# Patient Record
Sex: Female | Born: 1980 | Race: Black or African American | Hispanic: No | Marital: Married | Smoking: Never smoker
Health system: Southern US, Community
[De-identification: ages and names within clinical notes are randomized; demographics above are authoritative.]

## PROBLEM LIST (undated history)

## (undated) DIAGNOSIS — Z862 Personal history of diseases of the blood and blood-forming organs and certain disorders involving the immune mechanism: Secondary | ICD-10-CM

## (undated) HISTORY — PX: TUBAL LIGATION: SHX77

## (undated) HISTORY — PX: CHOLECYSTECTOMY: SHX55

## (undated) HISTORY — PX: GALLBLADDER SURGERY: SHX652

---

## 2001-02-10 ENCOUNTER — Observation Stay (HOSPITAL_COMMUNITY): Admission: AD | Admit: 2001-02-10 | Discharge: 2001-02-11 | Payer: Self-pay | Admitting: *Deleted

## 2001-02-17 ENCOUNTER — Inpatient Hospital Stay (HOSPITAL_COMMUNITY): Admission: AD | Admit: 2001-02-17 | Discharge: 2001-02-19 | Payer: Self-pay | Admitting: *Deleted

## 2002-02-17 ENCOUNTER — Ambulatory Visit (HOSPITAL_COMMUNITY): Admission: RE | Admit: 2002-02-17 | Discharge: 2002-02-17 | Payer: Self-pay | Admitting: *Deleted

## 2002-02-17 ENCOUNTER — Encounter: Payer: Self-pay | Admitting: *Deleted

## 2002-04-13 ENCOUNTER — Ambulatory Visit (HOSPITAL_COMMUNITY): Admission: AD | Admit: 2002-04-13 | Discharge: 2002-04-13 | Payer: Self-pay | Admitting: *Deleted

## 2002-04-18 ENCOUNTER — Inpatient Hospital Stay (HOSPITAL_COMMUNITY): Admission: AD | Admit: 2002-04-18 | Discharge: 2002-04-21 | Payer: Self-pay | Admitting: *Deleted

## 2002-09-30 ENCOUNTER — Emergency Department (HOSPITAL_COMMUNITY): Admission: EM | Admit: 2002-09-30 | Discharge: 2002-09-30 | Payer: Self-pay | Admitting: Emergency Medicine

## 2002-09-30 ENCOUNTER — Encounter: Payer: Self-pay | Admitting: *Deleted

## 2003-09-13 ENCOUNTER — Emergency Department (HOSPITAL_COMMUNITY): Admission: EM | Admit: 2003-09-13 | Discharge: 2003-09-13 | Payer: Self-pay | Admitting: Emergency Medicine

## 2003-12-01 ENCOUNTER — Emergency Department (HOSPITAL_COMMUNITY): Admission: EM | Admit: 2003-12-01 | Discharge: 2003-12-01 | Payer: Self-pay | Admitting: Emergency Medicine

## 2004-01-09 ENCOUNTER — Emergency Department (HOSPITAL_COMMUNITY): Admission: EM | Admit: 2004-01-09 | Discharge: 2004-01-09 | Payer: Self-pay | Admitting: Emergency Medicine

## 2004-01-30 ENCOUNTER — Ambulatory Visit: Payer: Self-pay | Admitting: Family Medicine

## 2004-02-12 ENCOUNTER — Ambulatory Visit (HOSPITAL_COMMUNITY): Admission: RE | Admit: 2004-02-12 | Discharge: 2004-02-12 | Payer: Self-pay | Admitting: Family Medicine

## 2004-02-27 ENCOUNTER — Ambulatory Visit: Payer: Self-pay | Admitting: Family Medicine

## 2004-03-04 ENCOUNTER — Emergency Department (HOSPITAL_COMMUNITY): Admission: EM | Admit: 2004-03-04 | Discharge: 2004-03-04 | Payer: Self-pay | Admitting: Emergency Medicine

## 2004-03-05 ENCOUNTER — Inpatient Hospital Stay (HOSPITAL_COMMUNITY): Admission: RE | Admit: 2004-03-05 | Discharge: 2004-03-08 | Payer: Self-pay | Admitting: Emergency Medicine

## 2004-03-26 ENCOUNTER — Ambulatory Visit: Payer: Self-pay | Admitting: Family Medicine

## 2004-05-19 ENCOUNTER — Ambulatory Visit: Payer: Self-pay | Admitting: Family Medicine

## 2004-10-20 ENCOUNTER — Ambulatory Visit (HOSPITAL_COMMUNITY): Admission: AD | Admit: 2004-10-20 | Discharge: 2004-10-20 | Payer: Self-pay | Admitting: Obstetrics and Gynecology

## 2004-10-29 ENCOUNTER — Ambulatory Visit (HOSPITAL_COMMUNITY): Admission: AD | Admit: 2004-10-29 | Discharge: 2004-10-29 | Payer: Self-pay | Admitting: Obstetrics and Gynecology

## 2004-11-02 ENCOUNTER — Ambulatory Visit (HOSPITAL_COMMUNITY): Admission: AD | Admit: 2004-11-02 | Discharge: 2004-11-02 | Payer: Self-pay | Admitting: Obstetrics and Gynecology

## 2004-11-15 ENCOUNTER — Ambulatory Visit (HOSPITAL_COMMUNITY): Admission: RE | Admit: 2004-11-15 | Discharge: 2004-11-15 | Payer: Self-pay | Admitting: Obstetrics and Gynecology

## 2004-12-24 ENCOUNTER — Inpatient Hospital Stay (HOSPITAL_COMMUNITY): Admission: AD | Admit: 2004-12-24 | Discharge: 2004-12-26 | Payer: Self-pay | Admitting: Obstetrics and Gynecology

## 2005-02-04 ENCOUNTER — Ambulatory Visit (HOSPITAL_COMMUNITY): Admission: RE | Admit: 2005-02-04 | Discharge: 2005-02-04 | Payer: Self-pay | Admitting: Obstetrics & Gynecology

## 2005-04-03 ENCOUNTER — Emergency Department (HOSPITAL_COMMUNITY): Admission: EM | Admit: 2005-04-03 | Discharge: 2005-04-03 | Payer: Self-pay | Admitting: Emergency Medicine

## 2005-11-07 ENCOUNTER — Emergency Department (HOSPITAL_COMMUNITY): Admission: EM | Admit: 2005-11-07 | Discharge: 2005-11-07 | Payer: Self-pay | Admitting: Emergency Medicine

## 2006-05-08 ENCOUNTER — Emergency Department (HOSPITAL_COMMUNITY): Admission: EM | Admit: 2006-05-08 | Discharge: 2006-05-08 | Payer: Self-pay | Admitting: Emergency Medicine

## 2006-07-23 ENCOUNTER — Emergency Department (HOSPITAL_COMMUNITY): Admission: EM | Admit: 2006-07-23 | Discharge: 2006-07-23 | Payer: Self-pay | Admitting: Emergency Medicine

## 2007-05-03 ENCOUNTER — Emergency Department (HOSPITAL_COMMUNITY): Admission: EM | Admit: 2007-05-03 | Discharge: 2007-05-03 | Payer: Self-pay | Admitting: Emergency Medicine

## 2007-05-23 ENCOUNTER — Emergency Department (HOSPITAL_COMMUNITY): Admission: EM | Admit: 2007-05-23 | Discharge: 2007-05-23 | Payer: Self-pay | Admitting: Emergency Medicine

## 2007-07-11 ENCOUNTER — Ambulatory Visit (HOSPITAL_COMMUNITY): Admission: RE | Admit: 2007-07-11 | Discharge: 2007-07-11 | Payer: Self-pay | Admitting: Internal Medicine

## 2007-07-15 ENCOUNTER — Emergency Department (HOSPITAL_COMMUNITY): Admission: EM | Admit: 2007-07-15 | Discharge: 2007-07-15 | Payer: Self-pay | Admitting: Emergency Medicine

## 2007-09-07 ENCOUNTER — Emergency Department (HOSPITAL_COMMUNITY): Admission: EM | Admit: 2007-09-07 | Discharge: 2007-09-07 | Payer: Self-pay | Admitting: Emergency Medicine

## 2007-11-08 ENCOUNTER — Emergency Department (HOSPITAL_COMMUNITY): Admission: EM | Admit: 2007-11-08 | Discharge: 2007-11-08 | Payer: Self-pay | Admitting: Emergency Medicine

## 2008-09-20 ENCOUNTER — Emergency Department (HOSPITAL_COMMUNITY): Admission: EM | Admit: 2008-09-20 | Discharge: 2008-09-20 | Payer: Self-pay | Admitting: Emergency Medicine

## 2008-11-20 ENCOUNTER — Emergency Department (HOSPITAL_COMMUNITY): Admission: EM | Admit: 2008-11-20 | Discharge: 2008-11-20 | Payer: Self-pay | Admitting: Emergency Medicine

## 2010-04-09 LAB — URINALYSIS, ROUTINE W REFLEX MICROSCOPIC
Bilirubin Urine: NEGATIVE
Glucose, UA: NEGATIVE mg/dL
Ketones, ur: NEGATIVE mg/dL
Protein, ur: NEGATIVE mg/dL

## 2010-04-09 LAB — URINE MICROSCOPIC-ADD ON

## 2010-04-09 LAB — URINE CULTURE

## 2010-04-11 LAB — COMPREHENSIVE METABOLIC PANEL
Albumin: 3.5 g/dL (ref 3.5–5.2)
BUN: 16 mg/dL (ref 6–23)
Creatinine, Ser: 0.86 mg/dL (ref 0.4–1.2)
Glucose, Bld: 82 mg/dL (ref 70–99)
Total Bilirubin: 1 mg/dL (ref 0.3–1.2)
Total Protein: 6.7 g/dL (ref 6.0–8.3)

## 2010-04-11 LAB — DIFFERENTIAL
Basophils Absolute: 0.1 10*3/uL (ref 0.0–0.1)
Lymphocytes Relative: 22 % (ref 12–46)
Monocytes Absolute: 0.5 10*3/uL (ref 0.1–1.0)
Monocytes Relative: 8 % (ref 3–12)
Neutro Abs: 4.6 10*3/uL (ref 1.7–7.7)
Neutrophils Relative %: 68 % (ref 43–77)

## 2010-04-11 LAB — CBC
HCT: 34.4 % — ABNORMAL LOW (ref 36.0–46.0)
Hemoglobin: 11.6 g/dL — ABNORMAL LOW (ref 12.0–15.0)
MCV: 82.6 fL (ref 78.0–100.0)
Platelets: 208 10*3/uL (ref 150–400)
RDW: 16.2 % — ABNORMAL HIGH (ref 11.5–15.5)

## 2010-04-11 LAB — URINALYSIS, ROUTINE W REFLEX MICROSCOPIC
Hgb urine dipstick: NEGATIVE
Protein, ur: NEGATIVE mg/dL
Urobilinogen, UA: 2 mg/dL — ABNORMAL HIGH (ref 0.0–1.0)

## 2010-05-23 NOTE — Discharge Summary (Signed)
Cynthia [name], Cynthia [name]              ACCOUNT NO.:  [id number]   MEDICAL RECORD NO.:  [id number]          PATIENT TYPE:  INP   LOCATION:  A336                          FACILITY:  [facility]   PHYSICIAN:  Jerolyn Shin C. Katrinka Blazing, M.D.   DATE OF BIRTH:  Mar 26, 1980   DATE OF ADMISSION:  03/05/2004  DATE OF DISCHARGE:  03/04/2006LH                                 DISCHARGE SUMMARY   DISCHARGE DIAGNOSES:  1.  Cholelithiasis, cholecystitis.  2.  Hypokalemia.   PROCEDURE:  Laparoscopic cholecystectomy on March 07, 2004.   DISPOSITION:  The patient discharged to home in stable, satisfactory  condition.   DISCHARGE MEDICATIONS:  1.  Tylox 1 or 2 every 4 hours as needed for pain.  2.  Phenergan 25 mg every 4 hours as needed for nausea.   SUMMARY:  A 30 year old female admitted for acute cholecystitis with  cholelithiasis. She has a greater than 2 week history of right upper  quadrant and back pain. She states that she had similar pains in November  and December of 2005 but they resolved. The pain became much worse 4 days  prior to admission. She had recurrent episodes of nausea and vomiting. She  was seen in the emergency room and was noted to have epigastric and right  upper quadrant tenderness. Ultrasound was done on the day of admission and  showed a gallbladder filled with stones with thickened wall. It was felt  that she had acute cholecystitis. The patient also had hypokalemia because  of nausea and vomiting. She was admitted for intravenous therapy, anti-  emetic therapy, and to replace her potassium prior to having definitive  surgical treatment of her gallbladder disease.   PAST MEDICAL HISTORY:  Otherwise unremarkable.   PHYSICAL EXAMINATION:  VITAL SIGNS:  Examination revealed her to be afebrile  with a blood pressure of 109/74, pulse 58, respiratory rate 20.  ABDOMEN:  Showed epigastric and right upper quadrant tenderness with mild  right lower quadrant tenderness with normal bowel  sounds.   LABORATORY DATA:  Serum potassium was 3.2, sodium 134, white count was  7,100. Liver panel was normal.   HOSPITAL COURSE:  The patient received intravenous and oral potassium. By  the 2nd, she felt much better. Potassium was 3.9. The patient was scheduled  for surgery on the 3rd, which she tolerated well. She had non  perioperative problems. By the 4th, she was stable. She had mild discomfort  from her incision. She had no nausea or vomiting. Vital signs were stable.  She was afebrile. The patient was discharged home in satisfactory condition  with plans to followup in the office as an outpatient.      LCS/MEDQ  D:  04/20/2004  T:  04/20/2004  Job:  [id number]   cc:   Dorthula Rue. Early Chars, MD  Fax: [phone]

## 2010-05-23 NOTE — H&P (Signed)
Cynthia [name], Cynthia [name]              ACCOUNT NO.:  [id number]   MEDICAL RECORD NO.:  [id number]          PATIENT TYPE:  OIB   LOCATION:  A415                          FACILITY:  [facility]   PHYSICIAN:  Tilda Burrow, M.D. DATE OF BIRTH:  [birth year]   DATE OF ADMISSION:  10/20/2004  DATE OF DISCHARGE:  LH                                HISTORY & PHYSICAL   REASON FOR ADMISSION:  Pregnancy at 28 weeks and 2 days with some uterine  contractions.   HISTORY OF PRESENT ILLNESS:  Cynthia [name] presented to the office today  complaining of some occasional cramping.  Mild contractions were noted with  the patient.  The patient was sent to labor and delivery.  The patient was  noted on the electronic fetal monitor to be having some very mild  contractions anywhere from 3-10 minutes apart.  Brethine 5 mg p.o. was given  with resolution of contractions noted.  The patient denies any pain at the  present time or any discomfort and desires discharge home.   PAST MEDICAL HISTORY:  Negative.   PAST SURGICAL HISTORY:  Positive for gallstones.   ALLERGIES:  She has no known allergies.   PLAN:  We are going to discharge her after giving her azithromycin 2 g p.o.  for too numerous to count wbc's on a wet prep.  GC and Chlamydia were  obtained at the office and cervix was closed, soft, long, and presenting  part is high.  We are also going to give her Brethine 5 mg p.o. q.4 h.  p.r.n. uterine contractions.  She knows she is to come back to the office  Friday, or if the contractions recur, she knows she is to come back as soon  as regular contraction pattern of more than four in one hour.      Cynthia [name], Cynthia [name]      Tilda Burrow, M.D.  Electronically Signed    DL/MEDQ  D:  16/10/9602  T:  10/20/2004  Job:  [id number]   cc:   [facility]

## 2010-05-23 NOTE — Op Note (Signed)
[facility]  Patient:    Cynthia [name], Cynthia [name] Visit Number: [id number] MRN: [id number]          Service Type: OBS Location: 4A A427 01 Attending Physician:  Jeri Cos. Dictated by:   Langley Gauss, M.D. Proc. Date: 02/17/01 Admit Date:  02/17/2001                             Operative Report  BRIEF HISTORY:  A 37-1/2 week intrauterine pregnancy presenting in labor, spontaneous assisted vaginal delivery of a 5 lb 12 ounce female infant delivered over an intact perineum.  SURGEON:  Langley Gauss, M.D.  ESTIMATED BLOOD LOSS:  Approximately 500 cc.  SPECIMENS:  Arterial cord gas and cord blood to pathology.  Placenta was examined and noted to be apparently intact with a true vessel and buccal cord.  SUMMARY:  The patient is a gravida 3, para 2, at 37-1/[redacted] weeks gestation who presented to [facility] complaining of increased frequency and intensity of uterine contractions throughout the day.  The patient has been seen previously in the office on todays date for a scheduled office visit at which time upon evaluation she was noted to be having regular uterine contractions and dilated to 4 cm.  She was thus referred to [facility] where she was noted to be contracting q.3-5 minutes with increasing intensity with a reassuring fetal heart rate.  The patient was then admitted in early stages of labor, thereafter amniotomy was performed with findings of clear amniotic fluid.  The patient had a reassuring heart rate throughout the course of labor with the onset of active labor contractions.  The patient began having decelerations which appeared to be either early or late type decelerations. When I evaluated the patient these were consistent with early decelerations. The patient thereafter progressed normally along the labor curve.  She began having some severe variable decelerations at which time she was examined and noted to be 8 cm  dilated.  Thereafter she progressed with 9 with gentle traction put on the cervix and was easily reduced to 10 cm.  Thereafter the patient pushed well during the short second stage of labor.  She was placed in the dorsolithotomy position, prepped and draped in the usual sterile manner. She pushed well with decent of the vertex to the peritoneal floor after which time the infant was noted to deliver in an direct OA position over an intact perineum.  Mouth and nares of the infant were bulb suctioned of clear amniotic fluid.  Renewed expulsive efforts resulted in delivery of the remainder of the infant without difficulty.  There was noted to an umbilical cord x 1 which was wrapped around the infants legs.  The umbilical cord was milked towards the infant, cord was doubly clamped and cut and infant is placed on the maternal abdomen for bonding purposes.  Arterial cord gas and cord blood are then obtained.  GEntle traction of the umbilical cord resulted in separation which upon examination appears to be an intact 3 vessel placenta and cord.  Examination of the genital tract reveals no lacerations and thus the mother is taken out of dorsolithotomy position . She does plan on bottle feeding only and this infant by her report has been placed up for adoption and arrangements have already been made with the adoption agency. Dictated by:   Langley Gauss, M.D. Attending Physician:  Jeri Cos. DD:  02/17/01 TD:  02/18/01 Job: [id number]

## 2010-05-23 NOTE — Op Note (Signed)
NAMEJUNICE, Cynthia [name]              ACCOUNT NO.:  [id number]   MEDICAL RECORD NO.:  [id number]          PATIENT TYPE:  INP   LOCATION:  A411                          FACILITY:  [facility]   PHYSICIAN:  Lazaro Arms, M.D.   DATE OF BIRTH:  November 28, 1980   DATE OF PROCEDURE:  12/24/2004  DATE OF DISCHARGE:  12/26/2004                                 OPERATIVE REPORT   Cynthia [name] is a 30 year old gravida 4, para 3 with 3 living children who came  in in active phase of labor who progressed quickly through the active phase  of labor. She underwent artificial rupture of membranes. She got Nubain for  pain management. At 2107 on December 24, 2004 after a short expulsive phase,  Cynthia [name] delivered a viable female infant with Apgars of 9 and 9, weighing 5  pounds and 2 ounces. There was a three-vessel cord. Cord blood and cord gas  were sent. Placenta was delivered spontaneously and was normal and intact.  The uterus was firm and below the umbilicus, and the blood loss for the  delivery was approximately 250 cc. The perineum was intact, and there were  no lacerations noted. The infant underwent routine neonatal resuscitation by  the nursing staff. The patient tolerated the delivery well, and she will  undergo routine postpartum care.      Lazaro Arms, M.D.  Electronically Signed     LHE/MEDQ  D:  01/21/2005  T:  01/21/2005  Job:  [id number]

## 2010-05-23 NOTE — Op Note (Signed)
NAMEPIEDAD, STANDIFORD              ACCOUNT NO.:  [id number]   MEDICAL RECORD NO.:  [id number]          PATIENT TYPE:  AMB   LOCATION:  DAY                           FACILITY:  [facility]   PHYSICIAN:  Lazaro Arms, M.D.   DATE OF BIRTH:  [birth year]   DATE OF PROCEDURE:  02/04/2005  DATE OF DISCHARGE:                                 OPERATIVE REPORT   PREOPERATIVE DIAGNOSIS:  Multiparous female, desires permanent  sterilization.   POSTOPERATIVE DIAGNOSIS:  Multiparous female, desires permanent  sterilization.   PROCEDURE:  Laparoscopic tubal ligation.   SURGEON:  Dr. Despina Hidden.   ANESTHESIA:  General endotracheal.   FINDINGS:  The patient had normal uterus, tubes and ovaries. Normal  intraperitoneal cavity.   DESCRIPTION OF OPERATION:  The patient was taken to the operating room,  placed in a supine position, under general endotracheal anesthesia, placed  in dorsal lithotomy position, prepped and draped in usual sterile fashion.  Incision made in the umbilicus. Veress needle was placed into the peritoneal  cavity one pass without difficulty. Peritoneal cavity was insufflated. The  nonbladed direct vision trocar was used and placed in the peritoneal cavity  with one pass without difficulty. Peritoneal cavity was insufflated more,  and an electrocautery unit was used, and a 2-cm segment in the distal  isthmic ampullary region of each tube was burned to no resistance and beyond  using electrocautery unit. There was good hemostasis. The patient tolerated  the procedure well. The instruments were removed. The gas was allowed to  escape. The fascia was closed using 0 Vicryl. The subcutaneous tissue was  closed using 0 Vicryl. Skin was closed using skin staples. The patient  tolerated the procedure well. She experienced minimal blood loss and was  taken to the recovery room in good and stable condition. She received Ancef  and Toradol prophylactically.      Lazaro Arms, M.D.  Electronically Signed     LHE/MEDQ  D:  02/04/2005  T:  02/04/2005  Job:  [id number]

## 2010-05-23 NOTE — H&P (Signed)
NAMETEMPRENCE, RHINES              ACCOUNT NO.:  [id number]   MEDICAL RECORD NO.:  [id number]          PATIENT TYPE:  INP   LOCATION:  A411                          FACILITY:  [facility]   PHYSICIAN:  Cynthia [name], M.D.   DATE OF BIRTH:  1980/04/26   DATE OF ADMISSION:  12/24/2004  DATE OF DISCHARGE:  12/22/2006LH                                HISTORY & PHYSICAL   Cynthia [name] is a 30 year old gravida 4, para 3 at [redacted] weeks gestation who  presents in active phase of labor. She has a history of rapid labor. She is  admitted for labor management.   PAST MEDICAL HISTORY:  Negative.   PAST SURGICAL HISTORY:  1.  Gallstones.  2.  Cholecystectomy.   PAST OBSTETRICAL HISTORY:  She has had three vaginal deliveries, two of  which have been 34 weeks. This pregnancy has been remarkable for preterm  labor, and she was on Brethine.   LABORATORY DATA:  She is B positive. Antibody screen is negative. Her  initial drug screen was initially positive for marijuana. Rubella is immune.  Hepatitis B was negative. HIV is nonreactive. HSV2 was negative. Serology is  nonreactive. GC and chlamydia are negative x2. Group B strep is negative.  Glucola is normal.   PHYSICAL EXAMINATION:  HEENT:  Unremarkable.  NECK:  Thyroid normal.  LUNGS:  Clear.  HEART:  Regular rhythm without murmur, rub or gallop.  BREASTS:  Deferred.  ABDOMEN:  Fundal height is 36 cm.  CERVIX:  At the time of admission is 4, 90 and 0.   IMPRESSION:  1.  Intrauterine pregnancy at [redacted] weeks gestation.  2.  Active phase of labor.   PLAN:  Management of labor and vaginal delivery.     Cynthia [name], M.D.  Electronically Signed    LHE/MEDQ  D:  01/21/2005  T:  01/21/2005  Job:  [id number]

## 2010-05-23 NOTE — H&P (Signed)
NAMECELSA, NORDAHL              ACCOUNT NO.:  [id number]   MEDICAL RECORD NO.:  [id number]          PATIENT TYPE:  AMB   LOCATION:  DAY                           FACILITY:  [facility]   PHYSICIAN:  Lazaro Arms, M.D.   DATE OF BIRTH:  May 23, 1980   DATE OF ADMISSION:  DATE OF DISCHARGE:  LH                                HISTORY & PHYSICAL   HISTORY OF PRESENT ILLNESS:  Cing is a 30 year old African-American  female, gravida 4, para 4, who desires permanent sterilization.  She  delivered back in December, a normal spontaneous vaginal delivery.  She  understands the permanent nature of the procedure.   PAST MEDICAL HISTORY:  Negative.   PAST SURGICAL HISTORY:  Cholecystectomy.   PAST OBSTETRICAL HISTORY:  Four vaginal deliveries.   ALLERGIES:  None.   MEDICATIONS:  None.   PHYSICAL EXAMINATION:  HEENT:  Unremarkable.  NECK:  Thyroid is normal.  LUNGS:  Clear.  HEART:  Regular rate and rhythm without murmur, regurgitation or gallop.  BREASTS:  Deferred.  ABDOMEN:  Benign.  Normal postpartum.  PELVIC:  Normal.  Uterus is normally involuted.  Ovaries are normal and  nontender.  EXTREMITIES:  Warm with no edema.  NEUROLOGIC:  Grossly intact.   IMPRESSION:  Multiparous female, desires permanent sterilization.   PLAN:  The patient is admitted for laparoscopic tubal ligation.  She  understands the risks, benefits, indications and alternatives and will  proceed.      Lazaro Arms, M.D.  Electronically Signed    LHE/MEDQ  D:  02/03/2005  T:  02/04/2005  Job:  [id number]

## 2010-05-23 NOTE — H&P (Signed)
[name], [name]                        ACCOUNT NO.:  [id number]   MEDICAL RECORD NO.:  [id number]                   PATIENT TYPE:  INP   LOCATION:  A417                                 FACILITY:  [facility]   PHYSICIAN:  Langley Gauss, M.D.                DATE OF BIRTH:  [birth year]   DATE OF ADMISSION:  04/18/2002  DATE OF DISCHARGE:  04/21/2002                                HISTORY & PHYSICAL   HISTORY OF PRESENT ILLNESS:  This is a 30 year old gravida 5, para 3 at 47  and 5/7ths weeks gestation who present to [facility] in early labor.  Patient was noted to have irregular uterine contractions during the daytime  and was noted on her observation period in labor and delivery to have  cervical changes to 6 cm dilated minus one stage and 60% effaced.  Thus she  was admitted in the early stage of active labor.   OBSTETRICAL HISTORY:  1. Pertinent for spontaneous vaginal delivery times two 1998 and 2000.     Infants delivered weighed 5 pounds 2.5 ounces and 4 pounds 2.4 ounces.  2. Patient had a termination of pregnancy 12/1998.  February 2003 patient     had an additional vaginal delivery this baby has been adopted out.  3. Likewise this pregnancy the patient has made preparations already for a     directed adoption.  4. Patient's prenatal course of this pregnancy has been uncomplicated.   ALLERGIES:  She has no known drug allergies.   CURRENT MEDICATIONS:  Prenatal vitamins only.   PHYSICAL EXAMINATION:  GENERAL:  A black female, height 5 foot 2, pre-  pregnancy weight 115, most recent 137, blood pressure 101/53, pulse 87,  respiratory rate 18, temp 97.2.  HEENT:  Reveals mucous membranes to be moist.  Thyroid is not palpable.  NECK:  Supple.  LUNGS:  Clear.  CARDIOVASCULAR:  Regular, rate and rhythm.  ABDOMEN:  Soft and nontender.  Gravid uterus identified.  Fundal height is  34 cm.  She is vertex presentation by [employer].  She has no  surgical scars  identified.  PELVIC:  Cervix is noted to be 6 cm dilated minus one station, posterior  position of the cervix, 60% effaced, vertex presentation noted.   HOSPITAL COURSE:  External fetal monitor reveals a reassuring fetal heart  rate.  Fetal heart rate baseline at 150,  accelerations noted.  No fetal  heart rate decelerations are noted.  Contractions are noted to be very mild  only every 5-15 minutes in frequency.    ASSESSMENT:  A 37 and 5/7ths weeks intrauterine pregnancy patient with  demonstrated cervical change during this observation period now at 6 cm  dilated, thus, patient is admitted in labor.  We will proceed with amniotomy  thereafter augment with Pitocin as clinically indicated.  Langley Gauss, M.D.    DC/MEDQ  D:  04/22/2002  T:  04/23/2002  Job:  [id number]

## 2010-05-23 NOTE — Discharge Summary (Signed)
Cynthia [name], Cynthia [name]              ACCOUNT NO.:  [id number]   MEDICAL RECORD NO.:  [id number]          PATIENT TYPE:  OIB   LOCATION:  A415                          FACILITY:  [facility]   PHYSICIAN:  Langley Gauss, MD     DATE OF BIRTH:  [birth year]   DATE OF ADMISSION:  11/02/2004  DATE OF DISCHARGE:  LH                                 DISCHARGE SUMMARY   HISTORY OF PRESENT ILLNESS:  A 30 year old gravida 3, para 2 at about [redacted]  weeks gestation who states she had some bloody mucus with wiping x1 today  and also complained of some pelvic cramping while working.  Apparently she  was on labor and delivery 4 days previously for similar complaints, at which  time she was treated with subcutaneous terbutaline sent home on p.o.  terbutaline which she has not had filled to this point in time.  She denies  any vaginal bleeding on undergarments, denies any leakage of fluid, any  significant vaginal discharge, and since arrival here she states that she  has had pelvic cramping with only one contraction since receiving a single  subcutaneous terbutaline.  The patient's prenatal course felt to be  otherwise uncomplicated.  She has B positive blood type.  She was positive  for THC on initial urine drug screen.   PAST MEDICAL HISTORY:  She has 2 prior vaginal deliveries without  complications.  She does have a history of gallstones.  No other medical or  surgical history.   PHYSICAL EXAMINATION:  No acute distress.  108/70, pulse rate 80,  respiratory rate is 20.  HEENT is negative.  No adenopathy.  NECK:  Supple, thyroid is nonpalpable.  LUNGS:  Clear.  CARDIOVASCULAR:  Regular rate and rhythm.  ABDOMEN:  Soft and nontender.  No surgical scars were identified.  Fundi is  consistent with [redacted] weeks gestation.  Uterus is soft and nontender.  PELVIC EXAMINATION:  Per __________, cervix is known to be closed, very long  and firm, normal appearing leukorrhea with no vaginal bleeding noted.  External fetal  monitor reveals a reassuring fetal heart rate.  No evidence  of any urine activity identified.   ASSESSMENT AND PLAN:  A 30-week gestation with recurrent threatened pre-term  labor.  The patient was treated x1 with subcutaneous terbutaline.  She is  discharged home at this time and will have the p.o. terbutaline filled and  comply with previous recommendations.  Signs and symptoms of labor as well  as spontaneous ruptured membranes reviewed with the patient prior to  discharge.   LABORATORY DATA:  October 29, 2004:  GBS carrier status negative.     Langley Gauss, MD  Electronically Signed    DC/MEDQ  D:  11/02/2004  T:  11/02/2004  Job:  [id number]

## 2010-05-23 NOTE — H&P (Signed)
NAMEIVONNA, KINNICK              ACCOUNT NO.:  [id number]   MEDICAL RECORD NO.:  [id number]          PATIENT TYPE:  OIB   LOCATION:  LDR2                          FACILITY:  [facility]   PHYSICIAN:  Tilda Burrow, M.D. DATE OF BIRTH:  1980-01-26   DATE OF ADMISSION:  10/29/2004  DATE OF DISCHARGE:  LH                                HISTORY & PHYSICAL   OBSERVATION ADMISSION   REASON FOR ADMISSION:  Pregnancy at 29 weeks with uterine cramping and pink  spotting.   HISTORY OF PRESENT ILLNESS:  Patient is a 30 year old gravida 4, para 2,  admitted with some mild cramping from home.   PAST MEDICAL HISTORY:  Negative.   PAST SURGICAL HISTORY:  Positive for gallbladder.   ALLERGIES:  She has no known allergies.   PRENATAL COURSE:  Essentially uneventful.  Blood type is A positive.  UDS  was positive with initial screening for THC. Rubella is immune.  Hepatitis B  surface antigen is negative.  HIV is negative.  Serology is nonreactive.   PHYSICAL EXAMINATION:  Vital signs were Stable.  Cervix is posterior, long,  closed and presenting part is high.   The patient was having contractions on admission, but resolved with 1 subcu  injection of terbutaline.   PLAN:  After approximately 4 hours of observation the patient has had no  uterine contractions and no changes in cervical dilatation.  She is to  followup in the office on Monday at 1:30 with Dr. Emelda Fear for assessment.  Also she knows that if she has more than 6 contractions in an hour she is to  come back. She has a prescription for Brethine 5 mg p.o. q.4 h. p.r.n. any  cramping; and she knows that she can come back or call, if needed, at any  time.     Zerita Boers, Lanier Clam      Tilda Burrow, M.D.  Electronically Signed   DL/MEDQ  D:  16/10/9602  T:  10/29/2004  Job:  [id number]

## 2010-05-23 NOTE — Op Note (Signed)
Cynthia [name], Cynthia [name]              ACCOUNT NO.:  [id number]   MEDICAL RECORD NO.:  [id number]          PATIENT TYPE:  INP   LOCATION:  A336                          FACILITY:  [facility]   PHYSICIAN:  Jerolyn Shin C. Katrinka Blazing, M.D.   DATE OF BIRTH:  Apr 19, 1980   DATE OF PROCEDURE:  03/07/2004  DATE OF DISCHARGE:  03/08/2004                                 OPERATIVE REPORT   PREOPERATIVE DIAGNOSIS:  Cholecystitis, cholelithiasis.   POSTOPERATIVE DIAGNOSIS:  Cholecystitis, cholelithiasis.   OPERATION/PROCEDURE:  Laparoscopic cholecystectomy.   SURGEON:  Dirk Dress. Katrinka Blazing, M.D.   DESCRIPTION OF PROCEDURE:  Under general anesthesia the patient's abdomen  was prepped and draped into a sterile field.  A supraumbilical incision was  made.  A Veress needle was inserted uneventfully.  Abdomen was insufflated  with 2.5 L of CO2.  Using a Visiport guide, a 10 mm port was placed  uneventfully.  Laparoscope was placed and the gallbladder was visualized.  The patient was placed under reverse Trendelenburg position.  Under  videoscopic guidance, a 10 mm port and two 5 mm ports were placed in the  right subcostal region.  The gallbladder was grasped by the assistant.  Cystic duct was dissected and clipped with five clips and divided.  The  cystic artery branches were dissected, clipped with three clips and divided.  The gallbladder was then separated from the intrahepatic space with  electrocautery.  It was placed in an EndoCatch device and retrieved.  Irrigation was carried out.  There was minimal drainage and essentially no  blood loss.  CO2 was allowed to escape from the abdomen and the ports were  removed.  The incisions were closed using 0 Dexon on the fascia of the  larger supraumbilical port and staples on the skin.  The patient tolerated  the procedure well.  She was awakened from anesthesia uneventfully,  transferred to a bed and taken to the post anesthesia care unit.      LCS/MEDQ  D:  04/20/2004  T:   04/21/2004  Job:  [id number]

## 2010-05-23 NOTE — Discharge Summary (Signed)
   NAMEJAMESINA, GAUGH                        ACCOUNT NO.:  [id number]   MEDICAL RECORD NO.:  [id number]                   PATIENT TYPE:  INP   LOCATION:  A417                                 FACILITY:  [facility]   PHYSICIAN:  Langley Gauss, M.D.                DATE OF BIRTH:  06/10/1980   DATE OF ADMISSION:  04/18/2002  DATE OF DISCHARGE:  04/21/2002                                 DISCHARGE SUMMARY   PROCEDURE PERFORMED:  Spontaneous assisted vaginal delivery of a viable,  vigorous female infant which went up for prearranged private adoption  services.   DISPOSITION:  The patient would desire permanent sterilization.  She will  follow up in our office in three weeks time at which time we can schedule  this for a five to six week postpartum tubal ligation.   DISCHARGE MEDICATIONS:  Vicoprofen for pain relief.   LABORATORY DATA:  B positive blood type.  Admission hemoglobin and  hematocrit of 9.2 and 28.4, postpartum day #1 9 and 27.8.  The patient is to  continue her prenatal vitamins for supplemental iron.   DISCHARGE INSTRUCTIONS:  The patient is given a copy of the standardized  discharge instructions.   HOSPITAL COURSE:  See previous dictations.  Postpartum the patient remained  in hospital until April 21, 2002 which was also the day that the baby left  for the adoption services.  At no point in time did the patient state any  indecisiveness regarding her proceeding with adoption proceedings as she has  had one previous infant that has likewise been placed through adoption  services.  She was discharged home on April 21, 2002.                                               Langley Gauss, M.D.    DC/MEDQ  D:  04/23/2002  T:  04/23/2002  Job:  [id number]

## 2010-05-23 NOTE — Discharge Summary (Signed)
[facility]  Patient:    [name], Cynthia Visit Number: [id number] MRN: [id number]          Service Type: OBS Location: 4A A427 01 Attending Physician:  Jeri Cos. Dictated by:   Langley Gauss, M.D. Admit Date:  02/17/2001 Discharge Date: 02/19/2001                             Discharge Summary  Patient discharged in my absence by Dr. Despina Hidden from [facility].  DIAGNOSES:  A 37 1/2 week intrauterine pregnancy presenting in labor, delivery performed on February 17, 2001.  PROCEDURE:  Spontaneous assisted vaginal delivery of 5 pound 11.9 ounce female infant.  Patient had made arrangements prior to the delivery for adoption to be performed through an adoption agency.  PERTINENT LABORATORY STUDIES:  B+ blood type.  Admission hemoglobin and hematocrit of 10.0/29.2, postpartum day #1 10.0/30.6.  HOSPITAL COURSE:  See previous dictations.  Patient did well postpartum.  She had no postpartum complications.  She was discharged home in my absence by Dr. Duane Lope on February 19, 2001.  Apparently, there were no changes in plans regarding plans for adoption of this infant through an agency. Dictated by:   Langley Gauss, M.D. Attending Physician:  Jeri Cos. DD:  03/03/01 TD:  03/03/01 Job: [id number]

## 2010-05-23 NOTE — Op Note (Signed)
NAME:  Cynthia [name], Cynthia [name]                        ACCOUNT NO.:  [id number]   MEDICAL RECORD NO.:  [id number]                   PATIENT TYPE:  INP   LOCATION:  A417                                 FACILITY:  [facility]   PHYSICIAN:  Langley Gauss, M.D.                DATE OF BIRTH:  1980/09/25   DATE OF PROCEDURE:  04/19/2002  DATE OF DISCHARGE:                                 OPERATIVE REPORT   DELIVERY NOTE:   DIAGNOSIS:  A 37-5/7 week intrauterine pregnancy presenting in prodromal  labor.   PROCEDURE:  Spontaneous assisted vaginal delivery of a 5 pound 13 ounce  female infant delivered over an intact perineum.   FINDINGS:  An umbilical cord which was wrapped one time around the right leg  below the knee, which had resulted in compression and variable decelerations  during the course of labor.   PHYSICIAN:  Langley Gauss, M.D.   ESTIMATED BLOOD LOSS:  Less than 500 mL.   SPECIMENS:  Arterial cord gas and cord blood to pathology laboratory.  The  placenta was examined and noted to be apparently intact with a three-vessel  umbilical cord.   MEDICATIONS:  The patient did not receive any narcotics or local analgesic  at time of delivery.   DELIVERY NOTE:  The patient is a gravida 5, para 3, at 37-5/7 weeks'  intrauterine pregnancy, who presented to [facility] the a.m. of  April 18, 2002, complaining of increasing frequency and intensity of uterine  contractions.  The patient was examined at that time and noted to be 5 cm  dilated, 60% effaced, with the vertex at a -1 station.  The patient was  observed throughout the morning.  She continued to have irregular uterine  contractions, but she did make cervical change to 6 cm.  Thus, in the p.m.  of 04/19/02 amniotomy was performed with findings of clear amniotic fluid.  A  fetal scalp electrode was placed.  The patient's labor course was  complicated by the moderate and severe variable decelerations due to cord  compression.   The patient was noted to progress rapidly from 6 to 8 cm  dilatation and continued to have increasing depth associated with the  decelerations.  The patient was noted to progress to 9 cm, at which time she  was encouraged to push.  The cervix was reduced to complete dilatation.  Thereafter the patient pushed very ineffectually during the second stage of  labor.  She was placed in the dorsal lithotomy position in an effort to  facilitate her pushing efforts.  The patient thereafter was able to push to  effect delivery over an intact perineum.  Mouth and nares were bulb-  suctioned of clear amniotic fluid, and renewed expulsive efforts resulted in  the spontaneous rotation to a right anterior shoulder position.  Gentle  abdominal traction combined with expulsive efforts resulted in delivery of  this anterior  shoulder beneath the pubic symphysis without difficulty.  A  spontaneous and vigorous breathe and cry is noted.  Arterial cord gas and  cord blood are then obtained from the placenta.  Gentle traction on the  umbilical cord results in separation, which upon examination appears to be  an intact placenta with associated three-vessel cord.  Fundal massage was  performed immediately to obtain uterine  tone.  No excessive vaginal bleeding is noted.  The patient was taken out of  the dorsal lithotomy position.  As the infant is to be placed for adoption,  which has been pre-arranged, the mother herself had minimal contact with the  infant, which thereafter was taken to the newborn nursery.                                               Langley Gauss, M.D.    DC/MEDQ  D:  04/19/2002  T:  04/19/2002  Job:  [id number]   cc:   Francoise Schaumann. Halm, D.O.  [address], [address]  [place]  [place] [address]  Fax: [phone]

## 2010-05-23 NOTE — H&P (Signed)
Cynthia [name], Cynthia [name]              ACCOUNT NO.:  [id number]   MEDICAL RECORD NO.:  [id number]          PATIENT TYPE:  INP   LOCATION:  A336                          FACILITY:  [facility]   PHYSICIAN:  Jerolyn Shin C. Katrinka Blazing, M.D.   DATE OF BIRTH:  11-Jan-1980   DATE OF ADMISSION:  03/05/2004  DATE OF DISCHARGE:  LH                                HISTORY & PHYSICAL   HISTORY OF PRESENT ILLNESS:  A 30 year old female admitted for acute  cholecystitis with cholelithiasis.  The patient gives a history of having  greater than a 2-week history of right upper quadrant and back pain.  The  pain was intermittent.  She had had previous pains in November and December,  but they resolved.  The pains became much more severe 4 days prior to  admission.  She had recurrent episodes of nausea with vomiting.  She was  seen in the emergency room on the day prior to admission, and was noted to  have epigastric and right upper quadrant tenderness, with an otherwise  normal evaluation.  She had an ultrasound done on the day of admission which  showed a gallbladder filled with gallstones, with the thickened wall.  The  patient has acute cholecystitis.  She is hypokalemic from her nausea and  vomiting, and will need potassium replacement.  She is admitted for  treatment, and will have a laparoscopic cholecystectomy this admission.   PAST HISTORY:  She has no chronic medical illness.   MEDICATIONS:  Her only medications is Ortho Tri-Cyclen Lo.   SURGERIES:  None.   ALLERGIES:  None.   FAMILY HISTORY:  Positive for diabetes mellitus, hypertension, and breast  cancer.  She has 3 siblings, one who has hypertension.   PHYSICAL EXAMINATION:  GENERAL:  The patient is very comfortable at the time  of the evaluation, but she has had mild resolution of pain.  She has had IV  analgesics.  VITAL SIGNS:  Blood pressure is 109/74, pulse 58, respirations 20,  temperature 98 degrees.  HEENT:  Unremarkable.  She is not jaundice.  NECK:  Supple.  No JVD, bruit, adenopathy, or thyromegaly.  CHEST:  Clear to auscultation.  HEART:  Regular rate and rhythm without murmur, gallop, or rub.  ABDOMEN:  Epigastric and mild right upper quadrant tenderness.  There is  also mild right lower quadrant tenderness.  She has normoactive bowel  sounds.  There is no abdominal distention.  EXTREMITIES:  No clubbing, cyanosis, or edema.  NEUROLOGIC:  No focal motor, sensory, or cerebellar deficits.   IMPRESSION:  1.  Acute cholecystitis with cholelithiasis.  2.  Hypokalemia.   PLAN:  The patient is admitted.  She will be given IV analgesics and anti-  emetics.  She will be started on Ancef 1 gm IV q.6h.  She will have  potassium replacement, IV and orally as tolerated, and we will schedule a  cholecystectomy on this admission.      LCS/MEDQ  D:  03/06/2004  T:  03/06/2004  Job:  [id number]

## 2010-09-30 LAB — INFLUENZA A+B VIRUS AG-DIRECT(RAPID)
Inflenza A Ag: NEGATIVE
Influenza B Ag: NEGATIVE

## 2010-10-08 LAB — CBC
HCT: 38.2
Platelets: 200
RDW: 15
WBC: 6

## 2010-10-08 LAB — BASIC METABOLIC PANEL
BUN: 13
Calcium: 9.5
GFR calc non Af Amer: >60
Glucose, Bld: 92
Potassium: 3.6

## 2010-10-08 LAB — DIFFERENTIAL
Basophils Absolute: 0
Lymphocytes Relative: 28
Lymphs Abs: 1.7
Neutro Abs: 3.7
Neutrophils Relative %: 62

## 2010-10-20 LAB — CBC
HCT: 37.4
Hemoglobin: 12.3
MCHC: 32.8
Platelets: 198
RDW: 16.4 — ABNORMAL HIGH

## 2010-10-20 LAB — URINALYSIS, ROUTINE W REFLEX MICROSCOPIC
Bilirubin Urine: NEGATIVE
Nitrite: POSITIVE — AB
Specific Gravity, Urine: 1.025
Urobilinogen, UA: 0.2
pH: 6

## 2010-10-20 LAB — DIFFERENTIAL
Basophils Absolute: 0
Lymphocytes Relative: 21
Lymphs Abs: 1.4
Monocytes Absolute: 0.4
Monocytes Relative: 6
Neutro Abs: 4.8

## 2010-10-20 LAB — BASIC METABOLIC PANEL
BUN: 13
CO2: 25
Calcium: 9.3
GFR calc non Af Amer: >60
Glucose, Bld: 85
Sodium: 140

## 2010-10-20 LAB — URINE CULTURE: Colony Count: 35000

## 2010-10-20 LAB — URINE MICROSCOPIC-ADD ON

## 2010-10-20 LAB — PREGNANCY, URINE: Preg Test, Ur: NEGATIVE

## 2011-05-26 ENCOUNTER — Emergency Department (HOSPITAL_COMMUNITY)
Admission: EM | Admit: 2011-05-26 | Discharge: 2011-05-26 | Disposition: A | Discharge status: Home / Self Care | Payer: BC Managed Care – PPO | Attending: Emergency Medicine | Admitting: Emergency Medicine

## 2011-05-26 ENCOUNTER — Encounter (HOSPITAL_COMMUNITY): Payer: Self-pay

## 2011-05-26 DIAGNOSIS — M25539 Pain in unspecified wrist: Secondary | ICD-10-CM | POA: Insufficient documentation

## 2011-05-26 DIAGNOSIS — M65849 Other synovitis and tenosynovitis, unspecified hand: Secondary | ICD-10-CM | POA: Insufficient documentation

## 2011-05-26 DIAGNOSIS — M778 Other enthesopathies, not elsewhere classified: Secondary | ICD-10-CM

## 2011-05-26 DIAGNOSIS — M65839 Other synovitis and tenosynovitis, unspecified forearm: Secondary | ICD-10-CM | POA: Insufficient documentation

## 2011-05-26 MED ORDER — IBUPROFEN 400 MG PO TABS
600.0000 mg | ORAL_TABLET | Freq: Once | ORAL | Status: AC
  Administered 2011-05-26: 600 mg via ORAL
  Filled 2011-05-26: qty 2

## 2011-05-26 NOTE — ED Provider Notes (Signed)
Medical screening examination/treatment/procedure(s) were performed by non-physician practitioner and as supervising physician I was immediately available for consultation/collaboration.   Joya Gaskins, MD 05/26/11 1114

## 2011-05-26 NOTE — Discharge Instructions (Signed)
Tendinitis Tendinitis is swelling and inflammation of the tendons. Tendons are band-like tissues that connect muscle to bone. Tendinitis commonly occurs in the:   Shoulders (rotator cuff).   Heels (Achilles tendon).   Elbows (triceps tendon).  CAUSES Tendinitis is usually caused by overusing the tendon, muscles, and joints involved. When the tissue surrounding a tendon (synovium) becomes inflamed, it is called tenosynovitis. Tendinitis commonly develops in people whose jobs require repetitive motions. SYMPTOMS  Pain.   Tenderness.   Mild swelling.  DIAGNOSIS Tendinitis is usually diagnosed by physical exam. Your caregiver may also order X-rays or other imaging tests. TREATMENT Your caregiver may recommend certain medicines or exercises for your treatment. HOME CARE INSTRUCTIONS   Use a sling or splint for as long as directed by your caregiver until the pain decreases.   Put ice on the injured area.   Put ice in a plastic bag.   Place a towel between your skin and the bag.   Leave the ice on for 15 to 20 minutes, 3 to 4 times a day.   Avoid using the limb while the tendon is painful. Perform gentle range of motion exercises only as directed by your caregiver. Stop exercises if pain or discomfort increase, unless directed otherwise by your caregiver.   Only take over-the-counter or prescription medicines for pain, discomfort, or fever as directed by your caregiver.  SEEK MEDICAL CARE IF:   Your pain and swelling increase.   You develop new, unexplained symptoms, especially increased numbness in the hands.  MAKE SURE YOU:   Understand these instructions.   Will watch your condition.   Will get help right away if you are not doing well or get worse.  Document Released: 12/20/1999 Document Revised: 12/11/2010 Document Reviewed: 03/10/2010 [facility] Patient Information 2012 [place], [place].   Take ibuprofen 600 mg every 8 hrs with food.  Wear the splint for comfort.   Follow up with dr. Hilda Lias as needed.

## 2011-05-26 NOTE — ED Notes (Signed)
Pt reports pain to her right wrist for the past 3 days.  Pt denies any injury.  Pt reports pain worsens with movement.

## 2011-05-26 NOTE — ED Provider Notes (Signed)
History     CSN: [id number]  Arrival date & time 05/26/11  4098   First MD Initiated Contact with Patient 05/26/11 [phone]      Chief Complaint  Patient presents with  . Wrist Pain    (Consider location/radiation/quality/duration/timing/severity/associated sxs/prior treatment) HPI Comments: No known injury.  R hand dominant.  "soreness".  No radiation.  Patient is a 31 y.o. female presenting with wrist pain. The history is provided by the patient.  Wrist Pain Episode onset: 3 days ago. The problem occurs constantly. The problem has been unchanged. Pertinent negatives include no joint swelling, numbness or weakness. Exacerbated by: wrist movement and thumb extension. She has tried nothing for the symptoms.    History reviewed. No pertinent past medical history.  Past Surgical History  Procedure Date  . Tubal ligation     No family history on file.  History  Substance Use Topics  . Smoking status: Never Smoker   . Smokeless tobacco: Not on file  . Alcohol Use: No    OB History    Grav Para Term Preterm Abortions TAB SAB Ect Mult Living                  Review of Systems  Musculoskeletal: Negative for joint swelling.  Neurological: Negative for weakness and numbness.  All other systems reviewed and are negative.    Allergies  Review of patient's allergies indicates no known allergies.  Home Medications  No current outpatient prescriptions on file.  BP 129/83  Pulse 80  Temp(Src) 98.3 F (36.8 C) (Oral)  Resp 18  Ht 5\' 2"  (1.575 m)  Wt 133 lb (60.328 kg)  BMI 24.33 kg/m2  SpO2 100%  LMP 04/10/2011  Physical Exam  Nursing note and vitals reviewed. Constitutional: She is oriented to person, place, and time. She appears well-developed and well-nourished. No distress.  HENT:  Head: Normocephalic and atraumatic.  Eyes: EOM are normal.  Neck: Normal range of motion.  Cardiovascular: Normal rate, regular rhythm and normal heart sounds.   Pulmonary/Chest:  Effort normal and breath sounds normal.  Abdominal: Soft. She exhibits no distension. There is no tenderness.  Musculoskeletal: Normal range of motion. She exhibits tenderness.       Right wrist: She exhibits tenderness. She exhibits normal range of motion, no bony tenderness, no effusion, no crepitus, no deformity and no laceration.       Right hand: normal sensation noted. Normal strength noted.       Hands: Neurological: She is alert and oriented to person, place, and time.  Skin: Skin is warm and dry.  Psychiatric: She has a normal mood and affect. Judgment normal.    ED Course  Procedures (including critical care time)  Labs Reviewed - No data to display No results found.   1. Tendonitis of wrist, right       MDM  Thumb spica, ice, ibuprofen 600 mg TID with food.  F/u with dr. Hilda Lias.        Worthy Rancher, [place] 05/26/11 [phone]

## 2017-02-16 ENCOUNTER — Other Ambulatory Visit: Payer: Self-pay

## 2017-02-16 ENCOUNTER — Emergency Department (HOSPITAL_COMMUNITY)
Admission: EM | Admit: 2017-02-16 | Discharge: 2017-02-16 | Disposition: A | Discharge status: Home / Self Care | Payer: Self-pay | Attending: Emergency Medicine | Admitting: Emergency Medicine

## 2017-02-16 ENCOUNTER — Encounter (HOSPITAL_COMMUNITY): Payer: Self-pay | Admitting: *Deleted

## 2017-02-16 DIAGNOSIS — Z79899 Other long term (current) drug therapy: Secondary | ICD-10-CM | POA: Insufficient documentation

## 2017-02-16 DIAGNOSIS — G43819 Other migraine, intractable, without status migrainosus: Secondary | ICD-10-CM

## 2017-02-16 DIAGNOSIS — Z7982 Long term (current) use of aspirin: Secondary | ICD-10-CM | POA: Insufficient documentation

## 2017-02-16 DIAGNOSIS — G43909 Migraine, unspecified, not intractable, without status migrainosus: Secondary | ICD-10-CM | POA: Insufficient documentation

## 2017-02-16 MED ORDER — DIPHENHYDRAMINE HCL 50 MG/ML IJ SOLN
25.0000 mg | Freq: Once | INTRAMUSCULAR | Status: AC
Start: 2017-02-16 — End: 2017-02-16
  Administered 2017-02-16: 25 mg via INTRAVENOUS
  Filled 2017-02-16: qty 1

## 2017-02-16 MED ORDER — METOCLOPRAMIDE HCL 5 MG/ML IJ SOLN
10.0000 mg | Freq: Once | INTRAMUSCULAR | Status: AC
  Administered 2017-02-16: 10 mg via INTRAVENOUS
  Filled 2017-02-16: qty 2

## 2017-02-16 MED ORDER — SODIUM CHLORIDE 0.9 % IV BOLUS (SEPSIS)
1000.0000 mL | Freq: Once | INTRAVENOUS | Status: AC
  Administered 2017-02-16: 1000 mL via INTRAVENOUS

## 2017-02-16 MED ORDER — KETOROLAC TROMETHAMINE 30 MG/ML IJ SOLN
30.0000 mg | Freq: Once | INTRAMUSCULAR | Status: AC
  Administered 2017-02-16: 30 mg via INTRAVENOUS
  Filled 2017-02-16: qty 1

## 2017-02-16 NOTE — ED Provider Notes (Signed)
[facility] EMERGENCY DEPARTMENT Provider Note   CSN: [id number] Arrival date & time: 02/16/17  0825     History   Chief Complaint Chief Complaint  Patient presents with  . Headache    HPI Cynthia [name] is a 37 y.o. female.  Left frontal headache with radiation to the left cheek since yesterday morning with associated vomiting and photophobia.  No meningeal signs or gross neurological deficits.  She has a remote history of migraine headaches.  He has tried Tylenol, Excedrin with minimal relief.  Headache is described as pounding and worse with walking.  Severity is moderate.      History reviewed. No pertinent past medical history.  There are no active problems to display for this patient.   Past Surgical History:  Procedure Laterality Date  . TUBAL LIGATION      OB History    No data available       Home Medications    Prior to Admission medications   Medication Sig Start Date End Date Taking? Authorizing Provider  acetaminophen (TYLENOL) 500 MG tablet Take 500 mg by mouth every 6 (six) hours as needed.   Yes [provider]  aspirin-acetaminophen-caffeine (EXCEDRIN MIGRAINE) [phone] MG tablet Take 2 tablets by mouth every 6 (six) hours as needed for headache.   Yes [provider]    Family History No family history on file.  Social History Social History   Tobacco Use  . Smoking status: Never Smoker  . Smokeless tobacco: Never Used  Substance Use Topics  . Alcohol use: Yes    Comment: "every now and then"   . Drug use: Yes    Types: Marijuana    Comment: last used 1 week ago as of 02/16/17     Allergies   Patient has no known allergies.   Review of Systems Review of Systems  All other systems reviewed and are negative.    Physical Exam Updated Vital Signs BP 114/79   Pulse (!) 58   Temp 98.2 F (36.8 C) (Oral)   Resp 18   Ht 5\' 2"  (1.575 m)   Wt 61.2 kg (135 lb)   LMP 02/11/2017   SpO2 100%   BMI 24.69  kg/m   Physical Exam  Constitutional: She is oriented to person, place, and time. She appears well-developed and well-nourished.  HENT:  Head: Normocephalic and atraumatic.  Eyes: Conjunctivae are normal.  Neck: Neck supple.  Cardiovascular: Normal rate and regular rhythm.  Pulmonary/Chest: Effort normal and breath sounds normal.  Abdominal: Soft. Bowel sounds are normal.  Musculoskeletal: Normal range of motion.  Neurological: She is alert and oriented to person, place, and time.  Skin: Skin is warm and dry.  Psychiatric: She has a normal mood and affect. Her behavior is normal.  Nursing note and vitals reviewed.    ED Treatments / Results  Labs (all labs ordered are listed, but only abnormal results are displayed) Labs Reviewed - No data to display  EKG  EKG Interpretation None       Radiology No results found.  Procedures Procedures (including critical care time)  Medications Ordered in ED Medications  sodium chloride 0.9 % bolus 1,000 mL (0 mLs Intravenous Stopped 02/16/17 1312)  ketorolac (TORADOL) 30 MG/ML injection 30 mg (30 mg Intravenous Given 02/16/17 1012)  diphenhydrAMINE (BENADRYL) injection 25 mg (25 mg Intravenous Given 02/16/17 1012)  metoCLOPramide (REGLAN) injection 10 mg (10 mg Intravenous Given 02/16/17 1012)     Initial Impression / Assessment  and Plan / ED Course  I have reviewed the triage vital signs and the nursing notes.  Pertinent labs & imaging results that were available during my care of the patient were reviewed by me and considered in my medical decision making (see chart for details).     Patient presents with persistent headache for greater than 24 hours.  Physical exam was normal.  She feels much better after hydration, IV Toradol, IV Reglan, IV Benadryl.  She is symptom-free at discharge.  Final Clinical Impressions(s) / ED Diagnoses   Final diagnoses:  Other migraine without status migrainosus, intractable    ED Discharge  Orders    None       Nat Christen, MD 02/16/17 [id number]

## 2017-02-16 NOTE — ED Triage Notes (Signed)
Pt c/o headache, vomiting, light sensitivity since yesterday morning. Pt has used Tylenol with no relief of pain. No hx of migraines.

## 2017-02-16 NOTE — Discharge Instructions (Signed)
Increase fluids.  Rest in quiet dark room.  Follow-up with your primary care doctor.

## 2017-02-16 NOTE — ED Notes (Signed)
Family at bedside. 

## 2018-09-08 ENCOUNTER — Other Ambulatory Visit: Payer: Self-pay | Admitting: *Deleted

## 2018-09-08 DIAGNOSIS — N926 Irregular menstruation, unspecified: Secondary | ICD-10-CM

## 2018-09-16 ENCOUNTER — Ambulatory Visit (HOSPITAL_COMMUNITY)
Admission: RE | Admit: 2018-09-16 | Discharge: 2018-09-16 | Disposition: A | Discharge status: Home / Self Care | Payer: Self-pay | Source: Ambulatory Visit | Attending: *Deleted | Admitting: *Deleted

## 2018-09-16 ENCOUNTER — Other Ambulatory Visit: Payer: Self-pay

## 2018-09-16 DIAGNOSIS — N926 Irregular menstruation, unspecified: Secondary | ICD-10-CM | POA: Insufficient documentation

## 2018-10-14 ENCOUNTER — Telehealth: Payer: Self-pay | Admitting: Obstetrics & Gynecology

## 2018-10-14 NOTE — Telephone Encounter (Signed)

## 2018-10-17 ENCOUNTER — Encounter: Payer: Self-pay | Admitting: Obstetrics & Gynecology

## 2018-10-17 ENCOUNTER — Other Ambulatory Visit: Payer: Self-pay

## 2018-10-17 ENCOUNTER — Ambulatory Visit (INDEPENDENT_AMBULATORY_CARE_PROVIDER_SITE_OTHER): Payer: 59 | Admitting: Obstetrics & Gynecology

## 2018-10-17 VITALS — BP 127/62 | HR 80 | Ht 62.0 in | Wt 141.5 lb

## 2018-10-17 DIAGNOSIS — N921 Excessive and frequent menstruation with irregular cycle: Secondary | ICD-10-CM

## 2018-10-17 DIAGNOSIS — N946 Dysmenorrhea, unspecified: Secondary | ICD-10-CM

## 2018-10-17 DIAGNOSIS — D259 Leiomyoma of uterus, unspecified: Secondary | ICD-10-CM

## 2018-10-17 NOTE — Progress Notes (Signed)
Chief Complaint  Patient presents with  . Fibroids      38 y.o. [id number] Patient's last menstrual period was 09/25/2018. The current method of family planning is tubal ligation.  Outpatient Encounter Medications as of 10/17/2018  Medication Sig  . acetaminophen (TYLENOL) 500 MG tablet Take 500 mg by mouth every 6 (six) hours as needed.  Marland Kitchen ibuprofen (ADVIL) 200 MG tablet Take 600 mg by mouth as needed.  . [DISCONTINUED] aspirin-acetaminophen-caffeine (EXCEDRIN MIGRAINE) 250-250-65 MG tablet Take 2 tablets by mouth every 6 (six) hours as needed for headache.   No facility-administered encounter medications on file as of 10/17/2018.     Subjective Pt with 7-9 day periods clotting wears tampons and pads together Soils sheets clothes Painful cramps Rare discomfort with intercourse Has had BTL Sonogram reveals fibroid uterus 268 cc volume Ovaries normal by sonogram   History reviewed. No pertinent past medical history.  Past Surgical History:  Procedure Laterality Date  . GALLBLADDER SURGERY    . TUBAL LIGATION      OB History    Gravida  6   Para  5   Term  4   Preterm  1   AB  1   Living  3     SAB  1   TAB      Ectopic      Multiple      Live Births  3           No Known Allergies  Social History   Socioeconomic History  . Marital status: Married    Spouse name: Not on file  . Number of children: Not on file  . Years of education: Not on file  . Highest education level: Not on file  Occupational History  . Not on file  Social Needs  . Financial resource strain: Not on file  . Food insecurity    Worry: Not on file    Inability: Not on file  . Transportation needs    Medical: Not on file    Non-medical: Not on file  Tobacco Use  . Smoking status: Never Smoker  . Smokeless tobacco: Never Used  Substance and Sexual Activity  . Alcohol use: Yes    Comment: "every now and then"   . Drug use: Not Currently    Types: Marijuana     Comment: last used 1 week ago as of 02/16/17  . Sexual activity: Yes    Birth control/protection: Surgical    Comment: tubal  Lifestyle  . Physical activity    Days per week: Not on file    Minutes per session: Not on file  . Stress: Not on file  Relationships  . Social Herbalist on phone: Not on file    Gets together: Not on file    Attends religious service: Not on file    Active member of club or organization: Not on file    Attends meetings of clubs or organizations: Not on file    Relationship status: Not on file  Other Topics Concern  . Not on file  Social History Narrative  . Not on file    Family History  Problem Relation Age of Onset  . Aneurysm Maternal Grandmother   . Diabetes Father   . Breast cancer Mother   . Hypertension Sister     Medications:       Current Outpatient Medications:  .  acetaminophen (TYLENOL) 500 MG tablet, Take 500  mg by mouth every 6 (six) hours as needed., Disp: , Rfl:  .  ibuprofen (ADVIL) 200 MG tablet, Take 600 mg by mouth as needed., Disp: , Rfl:   Objective Blood pressure 127/62, pulse 80, height 5\' 2"  (1.575 m), weight 141 lb 8 oz (64.2 kg), last menstrual period 09/25/2018.  Gen WDWN NAD BMI 26  Pertinent ROS No burning with urination, frequency or urgency No nausea, vomiting or diarrhea Nor fever chills or other constitutional symptoms   Labs or studies Reviewed   Study Result  CLINICAL DATA:  Irregular menstrual cycle  EXAM: TRANSABDOMINAL AND TRANSVAGINAL ULTRASOUND OF PELVIS  TECHNIQUE: Both transabdominal and transvaginal ultrasound examinations of the pelvis were performed. Transabdominal technique was performed for global imaging of the pelvis including uterus, ovaries, adnexal regions, and pelvic cul-de-sac. It was necessary to proceed with endovaginal exam following the transabdominal exam to visualize the uterus and adnexa.  COMPARISON:  CT abdomen and pelvis 07/23/2006  FINDINGS:  Uterus  Measurements: 10.9 x 5.9 x 8.0 cm = volume: 268 mL. Anteverted. Large LEFT lateral exophytic leiomyoma 4.7 x 4.8 x 5.0 cm. No additional uterine masses. Mildly inhomogeneous myometrium otherwise seen.  Endometrium  Thickness: 11 mm.  No endometrial fluid or focal abnormality  Right ovary  Measurements: 3.7 x 1.4 x 4.4 cm = volume: 11.6 mL. Seen only on transabdominal imaging. Normal morphology without mass  Left ovary  Measurements: 3.2 x 1.7 x 2.7 cm = volume: 7.8 mL. Seen only on transabdominal imaging. Normal morphology without mass  Other findings  No free pelvic fluid or adnexal masses.  IMPRESSION: Large LEFT lateral exophytic leiomyoma 5.0 cm greatest size.  Otherwise unremarkable exam.   Electronically Signed   By: Lavonia Dana M.D.   On: 09/16/2018 16:04   CBC Latest Ref Rng & Units 09/20/2008 09/07/2007 07/23/2006  WBC 4.0 - 10.5 K/uL 6.8 6.0 6.8  Hemoglobin 12.0 - 15.0 g/dL 11.6(L) 12.5 12.3  Hematocrit 36.0 - 46.0 % 34.4(L) 38.2 37.4  Platelets 150 - 400 K/uL 208 200 198      Impression Diagnoses this Encounter::   ICD-10-CM   1. Uterine leiomyoma, unspecified location  D25.9   2. Menometrorrhagia  N92.1   3. Dysmenorrhea  N94.6     Established relevant diagnosis(es):   Plan/Recommendations: No orders of the defined types were placed in this encounter.   Labs or Scans Ordered: No orders of the defined types were placed in this encounter.   Management:: >discussed with pt Abdominal supracervical hysterectomy preservation of her ovaries and cervix and pt agrees.  Try to schedule 11/09/2018  Pt understands the risks of surgery including but not limited t  excessive bleeding requiring transfusion or reoperation, post-operative infection requiring prolonged hospitalization or re-hospitalization and antibiotic therapy, and damage to other organs including bladder, bowel, ureters and major vessels.  The patient also understands  the alternative treatment options which were discussed in full.  All questions were answered.  Florian Buff 10/17/2018 2:17 PM   Follow up Return in about 1 month (around 11/17/2018) for Post Op, with Dr Elonda Husky.        Face to face time:  20 minutes  Greater than 50% of the visit time was spent in counseling and coordination of care with the patient.  The summary and outline of the counseling and care coordination is summarized in the note above.   All questions were answered.

## 2018-10-25 DIAGNOSIS — Z029 Encounter for administrative examinations, unspecified: Secondary | ICD-10-CM

## 2018-11-03 NOTE — Patient Instructions (Signed)
Cynthia [name]  11/03/2018     @PREFPERIOPPHARMACY @   Your procedure is scheduled on  11/09/2018 .  Report to [facility] at  [phone]  A.M.  Call this number if you have problems the morning of surgery:  [phone]   Remember:  Do not eat or drink after midnight.                        Take these medicines the morning of surgery with A SIP OF WATER None    Do not wear jewelry, make-up or nail polish.  Do not wear lotions, powders, or perfumes. Please ware deodorant and brush your teeth.  Do not shave 48 hours prior to surgery.  Men may shave face and neck.  Do not bring valuables to the hospital.  [facility] is not responsible for any belongings or valuables.  Contacts, dentures or bridgework may not be worn into surgery.  Leave your suitcase in the car.  After surgery it may be brought to your room.  For patients admitted to the hospital, discharge time will be determined by your treatment team.  Patients discharged the day of surgery will not be allowed to drive home.   Name and phone number of your driver:   family Special instructions:  None  Please read over the following fact sheets that you were given. Pain Booklet, Coughing and Deep Breathing, Blood Transfusion Information, MRSA Information, Surgical Site Infection Prevention, Anesthesia Post-op Instructions and Care and Recovery After Surgery       Vaginal Hysterectomy, Care After Refer to this sheet in the next few weeks. These instructions provide you with information about caring for yourself after your procedure. Your health care provider may also give you more specific instructions. Your treatment has been planned according to current medical practices, but problems sometimes occur. Call your health care provider if you have any problems or questions after your procedure. What can I expect after the procedure? After the procedure, it is common to have:  Pain.  Soreness and numbness in your  incision areas.  Vaginal bleeding and discharge.  Constipation.  Temporary problems emptying the bladder.  Feelings of sadness or other emotions. Follow these instructions at home: Medicines  Take over-the-counter and prescription medicines only as told by your health care provider.  If you were prescribed an antibiotic medicine, take it as told by your health care provider. Do not stop taking the antibiotic even if you start to feel better.  Do not drive or operate heavy machinery while taking prescription pain medicine. Activity  Return to your normal activities as told by your health care provider. Ask your health care provider what activities are safe for you.  Get regular exercise as told by your health care provider. You may be told to take short walks every day and go farther each time.  Do not lift anything that is heavier than 10 lb (4.5 kg). General instructions   Do not put anything in your vagina for 6 weeks after your surgery or as told by your health care provider. This includes tampons and douches.  Do not have sex until your health care provider says you can.  Do not take baths, swim, or use a hot tub until your health care provider approves.  Drink enough fluid to keep your urine clear or pale yellow.  Do not drive for 24 hours if you were given a sedative.  Keep all follow-up visits as told by your health care provider. This is important. Contact a health care provider if:  Your pain medicine is not helping.  You have a fever.  You have redness, swelling, or pain at your incision site.  You have blood, pus, or a bad-smelling discharge from your vagina.  You continue to have difficulty urinating. Get help right away if:  You have severe abdominal or back pain.  You have heavy bleeding from your vagina.  You have chest pain or shortness of breath. This information is not intended to replace advice given to you by your health care provider. Make  sure you discuss any questions you have with your health care provider. Document Released: 04/15/2015 Document Revised: 08/15/2015 Document Reviewed: 01/06/2015 Elsevier Patient Education  2020 [place] Anesthesia, Adult, Care After This sheet gives you information about how to care for yourself after your procedure. Your health care provider may also give you more specific instructions. If you have problems or questions, contact your health care provider. What can I expect after the procedure? After the procedure, the following side effects are common:  Pain or discomfort at the IV site.  Nausea.  Vomiting.  Sore throat.  Trouble concentrating.  Feeling cold or chills.  Weak or tired.  Sleepiness and fatigue.  Soreness and body aches. These side effects can affect parts of the body that were not involved in surgery. Follow these instructions at home:  For at least 24 hours after the procedure:  Have a responsible adult stay with you. It is important to have someone help care for you until you are awake and alert.  Rest as needed.  Do not: ? Participate in activities in which you could fall or become injured. ? Drive. ? Use heavy machinery. ? Drink alcohol. ? Take sleeping pills or medicines that cause drowsiness. ? Make important decisions or sign legal documents. ? Take care of children on your own. Eating and drinking  Follow any instructions from your health care provider about eating or drinking restrictions.  When you feel hungry, start by eating small amounts of foods that are soft and easy to digest (bland), such as toast. Gradually return to your regular diet.  Drink enough fluid to keep your urine pale yellow.  If you vomit, rehydrate by drinking water, juice, or clear broth. General instructions  If you have sleep apnea, surgery and certain medicines can increase your risk for breathing problems. Follow instructions from your health care  provider about wearing your sleep device: ? Anytime you are sleeping, including during daytime naps. ? While taking prescription pain medicines, sleeping medicines, or medicines that make you drowsy.  Return to your normal activities as told by your health care provider. Ask your health care provider what activities are safe for you.  Take over-the-counter and prescription medicines only as told by your health care provider.  If you smoke, do not smoke without supervision.  Keep all follow-up visits as told by your health care provider. This is important. Contact a health care provider if:  You have nausea or vomiting that does not get better with medicine.  You cannot eat or drink without vomiting.  You have pain that does not get better with medicine.  You are unable to pass urine.  You develop a skin rash.  You have a fever.  You have redness around your IV site that gets worse. Get help right away if:  You have difficulty breathing.  You have chest pain.  You have blood in your urine or stool, or you vomit blood. Summary  After the procedure, it is common to have a sore throat or nausea. It is also common to feel tired.  Have a responsible adult stay with you for the first 24 hours after general anesthesia. It is important to have someone help care for you until you are awake and alert.  When you feel hungry, start by eating small amounts of foods that are soft and easy to digest (bland), such as toast. Gradually return to your regular diet.  Drink enough fluid to keep your urine pale yellow.  Return to your normal activities as told by your health care provider. Ask your health care provider what activities are safe for you. This information is not intended to replace advice given to you by your health care provider. Make sure you discuss any questions you have with your health care provider. Document Released: 03/30/2000 Document Revised: 12/25/2016 Document  Reviewed: 08/07/2016 Elsevier Patient Education  2020 [employer]. How to Use Chlorhexidine for Bathing Chlorhexidine gluconate (CHG) is a germ-killing (antiseptic) solution that is used to clean the skin. It can get rid of the bacteria that normally live on the skin and can keep them away for about 24 hours. To clean your skin with CHG, you may be given:  A CHG solution to use in the shower or as part of a sponge bath.  A prepackaged cloth that contains CHG. Cleaning your skin with CHG may help lower the risk for infection:  While you are staying in the intensive care unit of the hospital.  If you have a vascular access, such as a central line, to provide short-term or long-term access to your veins.  If you have a catheter to drain urine from your bladder.  If you are on a ventilator. A ventilator is a machine that helps you breathe by moving air in and out of your lungs.  After surgery. What are the risks? Risks of using CHG include:  A skin reaction.  Hearing loss, if CHG gets in your ears.  Eye injury, if CHG gets in your eyes and is not rinsed out.  The CHG product catching fire. Make sure that you avoid smoking and flames after applying CHG to your skin. Do not use CHG:  If you have a chlorhexidine allergy or have previously reacted to chlorhexidine.  On babies younger than 55 months of age. How to use CHG solution  Use CHG only as told by your health care provider, and follow the instructions on the label.  Use the full amount of CHG as directed. Usually, this is one bottle. During a shower Follow these steps when using CHG solution during a shower (unless your health care provider gives you different instructions): 1. Start the shower. 2. Use your normal soap and shampoo to wash your face and hair. 3. Turn off the shower or move out of the shower stream. 4. Pour the CHG onto a clean washcloth. Do not use any type of brush or rough-edged sponge. 5. Starting at  your neck, lather your body down to your toes. Make sure you follow these instructions: ? If you will be having surgery, pay special attention to the part of your body where you will be having surgery. Scrub this area for at least 1 minute. ? Do not use CHG on your head or face. If the solution gets into your ears or eyes, rinse them  well with water. ? Avoid your genital area. ? Avoid any areas of skin that have broken skin, cuts, or scrapes. ? Scrub your back and under your arms. Make sure to wash skin folds. 6. Let the lather sit on your skin for 1-2 minutes or as long as told by your health care provider. 7. Thoroughly rinse your entire body in the shower. Make sure that all body creases and crevices are rinsed well. 8. Dry off with a clean towel. Do not put any substances on your body afterward--such as powder, lotion, or perfume--unless you are told to do so by your health care provider. Only use lotions that are recommended by the manufacturer. 9. Put on clean clothes or pajamas. 10. If it is the night before your surgery, sleep in clean sheets.  During a sponge bath Follow these steps when using CHG solution during a sponge bath (unless your health care provider gives you different instructions): 1. Use your normal soap and shampoo to wash your face and hair. 2. Pour the CHG onto a clean washcloth. 3. Starting at your neck, lather your body down to your toes. Make sure you follow these instructions: ? If you will be having surgery, pay special attention to the part of your body where you will be having surgery. Scrub this area for at least 1 minute. ? Do not use CHG on your head or face. If the solution gets into your ears or eyes, rinse them well with water. ? Avoid your genital area. ? Avoid any areas of skin that have broken skin, cuts, or scrapes. ? Scrub your back and under your arms. Make sure to wash skin folds. 4. Let the lather sit on your skin for 1-2 minutes or as long as told  by your health care provider. 5. Using a different clean, wet washcloth, thoroughly rinse your entire body. Make sure that all body creases and crevices are rinsed well. 6. Dry off with a clean towel. Do not put any substances on your body afterward--such as powder, lotion, or perfume--unless you are told to do so by your health care provider. Only use lotions that are recommended by the manufacturer. 7. Put on clean clothes or pajamas. 8. If it is the night before your surgery, sleep in clean sheets. How to use CHG prepackaged cloths  Only use CHG cloths as told by your health care provider, and follow the instructions on the label.  Use the CHG cloth on clean, dry skin.  Do not use the CHG cloth on your head or face unless your health care provider tells you to.  When washing with the CHG cloth: ? Avoid your genital area. ? Avoid any areas of skin that have broken skin, cuts, or scrapes. Before surgery Follow these steps when using a CHG cloth to clean before surgery (unless your health care provider gives you different instructions): 1. Using the CHG cloth, vigorously scrub the part of your body where you will be having surgery. Scrub using a back-and-forth motion for 3 minutes. The area on your body should be completely wet with CHG when you are done scrubbing. 2. Do not rinse. Discard the cloth and let the area air-dry. Do not put any substances on the area afterward, such as powder, lotion, or perfume. 3. Put on clean clothes or pajamas. 4. If it is the night before your surgery, sleep in clean sheets.  For general bathing Follow these steps when using CHG cloths for general bathing (unless your  health care provider gives you different instructions). 1. Use a separate CHG cloth for each area of your body. Make sure you wash between any folds of skin and between your fingers and toes. Wash your body in the following order, switching to a new cloth after each step: ? The front of your  neck, shoulders, and chest. ? Both of your arms, under your arms, and your hands. ? Your stomach and groin area, avoiding the genitals. ? Your right leg and foot. ? Your left leg and foot. ? The back of your neck, your back, and your buttocks. 2. Do not rinse. Discard the cloth and let the area air-dry. Do not put any substances on your body afterward--such as powder, lotion, or perfume--unless you are told to do so by your health care provider. Only use lotions that are recommended by the manufacturer. 3. Put on clean clothes or pajamas. Contact a health care provider if:  Your skin gets irritated after scrubbing.  You have questions about using your solution or cloth. Get help right away if:  Your eyes become very red or swollen.  Your eyes itch badly.  Your skin itches badly and is red or swollen.  Your hearing changes.  You have trouble seeing.  You have swelling or tingling in your mouth or throat.  You have trouble breathing.  You swallow any chlorhexidine. Summary  Chlorhexidine gluconate (CHG) is a germ-killing (antiseptic) solution that is used to clean the skin. Cleaning your skin with CHG may help to lower your risk for infection.  You may be given CHG to use for bathing. It may be in a bottle or in a prepackaged cloth to use on your skin. Carefully follow your health care provider's instructions and the instructions on the product label.  Do not use CHG if you have a chlorhexidine allergy.  Contact your health care provider if your skin gets irritated after scrubbing. This information is not intended to replace advice given to you by your health care provider. Make sure you discuss any questions you have with your health care provider. Document Released: 09/16/2011 Document Revised: 03/10/2018 Document Reviewed: 11/19/2016 Elsevier Patient Education  2020 [employer].

## 2018-11-07 ENCOUNTER — Telehealth: Payer: Self-pay | Admitting: Obstetrics & Gynecology

## 2018-11-07 ENCOUNTER — Other Ambulatory Visit (HOSPITAL_COMMUNITY)
Admission: RE | Admit: 2018-11-07 | Discharge: 2018-11-07 | Disposition: A | Discharge status: Home / Self Care | Payer: 59 | Source: Ambulatory Visit | Attending: Obstetrics & Gynecology | Admitting: Obstetrics & Gynecology

## 2018-11-07 ENCOUNTER — Encounter (HOSPITAL_COMMUNITY): Payer: Self-pay

## 2018-11-07 ENCOUNTER — Other Ambulatory Visit: Payer: Self-pay

## 2018-11-07 ENCOUNTER — Encounter (HOSPITAL_COMMUNITY)
Admission: RE | Admit: 2018-11-07 | Discharge: 2018-11-07 | Disposition: A | Discharge status: Home / Self Care | Payer: 59 | Source: Ambulatory Visit | Attending: Obstetrics & Gynecology | Admitting: Obstetrics & Gynecology

## 2018-11-07 DIAGNOSIS — Z01812 Encounter for preprocedural laboratory examination: Secondary | ICD-10-CM | POA: Insufficient documentation

## 2018-11-07 LAB — URINALYSIS, ROUTINE W REFLEX MICROSCOPIC
Bilirubin Urine: NEGATIVE
Glucose, UA: NEGATIVE mg/dL
Hgb urine dipstick: NEGATIVE
Ketones, ur: NEGATIVE mg/dL
Leukocytes,Ua: NEGATIVE
Nitrite: NEGATIVE
Protein, ur: NEGATIVE mg/dL
Specific Gravity, Urine: 1.018 (ref 1.005–1.030)
pH: 7 (ref 5.0–8.0)

## 2018-11-07 LAB — CBC
HCT: 23.5 % — ABNORMAL LOW (ref 36.0–46.0)
Hemoglobin: 5.8 g/dL — CL (ref 12.0–15.0)
MCH: 15.5 pg — ABNORMAL LOW (ref 26.0–34.0)
MCHC: 24.7 g/dL — ABNORMAL LOW (ref 30.0–36.0)
MCV: 63 fL — ABNORMAL LOW (ref 80.0–100.0)
Platelets: 53 10*3/uL — ABNORMAL LOW (ref 150–400)
RBC: 3.73 MIL/uL — ABNORMAL LOW (ref 3.87–5.11)
RDW: 20.9 % — ABNORMAL HIGH (ref 11.5–15.5)
WBC: 4.6 10*3/uL (ref 4.0–10.5)
nRBC: 0.4 % — ABNORMAL HIGH (ref 0.0–0.2)

## 2018-11-07 LAB — HCG, QUANTITATIVE, PREGNANCY: hCG, Beta Chain, Quant, S: <1 m[IU]/mL (ref <5)

## 2018-11-07 LAB — COMPREHENSIVE METABOLIC PANEL
ALT: 15 U/L (ref 0–44)
AST: 16 U/L (ref 15–41)
Albumin: 3.8 g/dL (ref 3.5–5.0)
Alkaline Phosphatase: 51 U/L (ref 38–126)
Anion gap: 8 (ref 5–15)
BUN: 16 mg/dL (ref 6–20)
CO2: 21 mmol/L — ABNORMAL LOW (ref 22–32)
Calcium: 8.6 mg/dL — ABNORMAL LOW (ref 8.9–10.3)
Chloride: 112 mmol/L — ABNORMAL HIGH (ref 98–111)
Creatinine, Ser: 0.98 mg/dL (ref 0.44–1.00)
GFR calc Af Amer: >60 mL/min (ref >60)
GFR calc non Af Amer: >60 mL/min (ref >60)
Glucose, Bld: 103 mg/dL — ABNORMAL HIGH (ref 70–99)
Potassium: 3.8 mmol/L (ref 3.5–5.1)
Sodium: 141 mmol/L (ref 135–145)
Total Bilirubin: 0.3 mg/dL (ref 0.3–1.2)
Total Protein: 6.8 g/dL (ref 6.5–8.1)

## 2018-11-07 LAB — RAPID HIV SCREEN (HIV 1/2 AB+AG)
HIV 1/2 Antibodies: NONREACTIVE
HIV-1 P24 Antigen - HIV24: NONREACTIVE

## 2018-11-07 LAB — PREPARE RBC (CROSSMATCH)

## 2018-11-07 LAB — SARS CORONAVIRUS 2 (TAT 6-24 HRS): SARS Coronavirus 2: NEGATIVE

## 2018-11-07 NOTE — Telephone Encounter (Signed)
Patient called, stated that she is returning your call.  [phone]

## 2018-11-07 NOTE — Telephone Encounter (Signed)
Patient called, stated that she is returning your call.  [phone]

## 2018-11-07 NOTE — Pre-Procedure Instructions (Signed)
Lab calls with critical Hgb of 5.8. Dr Elonda Husky notified and wants patient transfused with 2 units PRB's on am of surgery, prior to start. He also wants to have 2 units on standby for surgery. He is to call patient and let her know of this.Will ove second case up to first to allow time for this. Ladona Horns, OR scheduler aware and changed schedule.

## 2018-11-08 NOTE — OR Nursing (Signed)
Lab called in regards to 2 units of PRBC that is needed in am at 615.  Blood is coming from [facility] due to antibodies. Per lab at [facility] it should be available in am.  Lab to call if there is going to be any issues.

## 2018-11-09 ENCOUNTER — Encounter (HOSPITAL_COMMUNITY): Payer: Self-pay | Admitting: Anesthesiology

## 2018-11-09 ENCOUNTER — Inpatient Hospital Stay (HOSPITAL_COMMUNITY)
Admission: RE | Admit: 2018-11-09 | Discharge: 2018-11-10 | DRG: 812 | Disposition: A | Discharge status: Home / Self Care | Payer: 59 | Attending: Obstetrics & Gynecology | Admitting: Obstetrics & Gynecology

## 2018-11-09 ENCOUNTER — Other Ambulatory Visit: Payer: Self-pay

## 2018-11-09 ENCOUNTER — Encounter (HOSPITAL_COMMUNITY)
Admission: RE | Disposition: A | Discharge status: Home / Self Care | Payer: Self-pay | Source: Home / Self Care | Attending: Obstetrics & Gynecology

## 2018-11-09 ENCOUNTER — Encounter (HOSPITAL_COMMUNITY): Payer: Self-pay

## 2018-11-09 DIAGNOSIS — Z90710 Acquired absence of both cervix and uterus: Secondary | ICD-10-CM | POA: Diagnosis present

## 2018-11-09 DIAGNOSIS — Z20828 Contact with and (suspected) exposure to other viral communicable diseases: Secondary | ICD-10-CM | POA: Diagnosis present

## 2018-11-09 DIAGNOSIS — D500 Iron deficiency anemia secondary to blood loss (chronic): Principal | ICD-10-CM | POA: Diagnosis present

## 2018-11-09 DIAGNOSIS — N921 Excessive and frequent menstruation with irregular cycle: Secondary | ICD-10-CM | POA: Diagnosis present

## 2018-11-09 DIAGNOSIS — N946 Dysmenorrhea, unspecified: Secondary | ICD-10-CM | POA: Diagnosis present

## 2018-11-09 DIAGNOSIS — D259 Leiomyoma of uterus, unspecified: Secondary | ICD-10-CM | POA: Diagnosis present

## 2018-11-09 DIAGNOSIS — D696 Thrombocytopenia, unspecified: Secondary | ICD-10-CM | POA: Diagnosis present

## 2018-11-09 DIAGNOSIS — Z5309 Procedure and treatment not carried out because of other contraindication: Secondary | ICD-10-CM | POA: Diagnosis present

## 2018-11-09 LAB — HEMOGLOBIN AND HEMATOCRIT, BLOOD
HCT: 31.7 % — ABNORMAL LOW (ref 36.0–46.0)
Hemoglobin: 8.9 g/dL — ABNORMAL LOW (ref 12.0–15.0)

## 2018-11-09 LAB — PREPARE RBC (CROSSMATCH)

## 2018-11-09 SURGERY — HYSTERECTOMY, SUPRACERVICAL, ABDOMINAL
Anesthesia: General

## 2018-11-09 MED ORDER — SODIUM CHLORIDE 0.9% IV SOLUTION: Freq: Once | INTRAVENOUS | Status: DC

## 2018-11-09 MED ORDER — KETOROLAC TROMETHAMINE 30 MG/ML IJ SOLN
30.0000 mg | Freq: Once | INTRAMUSCULAR | Status: DC
  Filled 2018-11-09: qty 1

## 2018-11-09 MED ORDER — BUPIVACAINE LIPOSOME 1.3 % IJ SUSP
INTRAMUSCULAR | Status: AC
  Filled 2018-11-09: qty 20

## 2018-11-09 MED ORDER — CEFAZOLIN SODIUM-DEXTROSE 2-4 GM/100ML-% IV SOLN
2.0000 g | INTRAVENOUS | Status: DC
  Filled 2018-11-09: qty 100

## 2018-11-09 MED ORDER — BUPIVACAINE LIPOSOME 1.3 % IJ SUSP
20.0000 mL | Freq: Once | INTRAMUSCULAR | Status: DC
  Filled 2018-11-09: qty 20

## 2018-11-09 NOTE — OR Nursing (Signed)
Instructed patient not to eat anything after MN due to her going to have her surgery tomorrow around 10. Also reported that to her nurse Brandi.  Patient and Velna Hatchet also instructed that she will be getting a unit platelets .

## 2018-11-09 NOTE — H&P (Signed)
Preoperative History and Physical  Cynthia [name] is a 38 y.o. [phone] with Patient's last menstrual period was 10/19/2018 (exact date). admitted for a abdominal supracervical hysterectomy due to enlarged fibroid uterus with menometrorrhagia and dysmenorrhea, significant symptomatic anemia requiring transfusion of PRBC and platelets.    PMH:   History reviewed. No pertinent past medical history.  PSH:     Past Surgical History:  Procedure Laterality Date  . CHOLECYSTECTOMY    . GALLBLADDER SURGERY    . TUBAL LIGATION      POb/GynH:      OB History    Gravida  6   Para  5   Term  4   Preterm  1   AB  1   Living  3     SAB  1   TAB      Ectopic      Multiple      Live Births  3           SH:   Social History   Tobacco Use  . Smoking status: Never Smoker  . Smokeless tobacco: Never Used  Substance Use Topics  . Alcohol use: Yes    Comment: "every now and then"   . Drug use: Not Currently    Types: Marijuana    Comment: Last used July 2020.    FH:    Family History  Problem Relation Age of Onset  . Aneurysm Maternal Grandmother   . Diabetes Father   . Breast cancer Mother   . Hypertension Sister      Allergies: No Known Allergies  Medications:       Current Facility-Administered Medications:  .  0.9 %  sodium chloride infusion (Manually program via Guardrails IV Fluids), , Intravenous, Once, Florian Buff, MD, Last Rate: 10 mL/hr at 11/09/18 1647 .  0.9 %  sodium chloride infusion (Manually program via Guardrails IV Fluids), , Intravenous, Once, Florian Buff, MD  Review of Systems:   Review of Systems  Constitutional: Negative for fever, chills, weight loss, malaise/fatigue and diaphoresis.  HENT: Negative for hearing loss, ear pain, nosebleeds, congestion, sore throat, neck pain, tinnitus and ear discharge.   Eyes: Negative for blurred vision, double vision, photophobia, pain, discharge and redness.  Respiratory: Negative for cough,  hemoptysis, sputum production, shortness of breath, wheezing and stridor.   Cardiovascular: Negative for chest pain, palpitations, orthopnea, claudication, leg swelling and PND.  Gastrointestinal: Positive for abdominal pain. Negative for heartburn, nausea, vomiting, diarrhea, constipation, blood in stool and melena.  Genitourinary: Negative for dysuria, urgency, frequency, hematuria and flank pain.  Musculoskeletal: Negative for myalgias, back pain, joint pain and falls.  Skin: Negative for itching and rash.  Neurological: Negative for dizziness, tingling, tremors, sensory change, speech change, focal weakness, seizures, loss of consciousness, weakness and headaches.  Endo/Heme/Allergies: Negative for environmental allergies and polydipsia. Does not bruise/bleed easily.  Psychiatric/Behavioral: Negative for depression, suicidal ideas, hallucinations, memory loss and substance abuse. The patient is not nervous/anxious and does not have insomnia.      PHYSICAL EXAM:  Blood pressure 124/82, pulse 62, temperature 98.9 F (37.2 C), temperature source Oral, resp. rate 16, height 5' 2.01" (1.575 m), weight 65.7 kg, last menstrual period 10/19/2018, SpO2 100 %.    Vitals reviewed. Constitutional: She is oriented to person, place, and time. She appears well-developed and well-nourished.  HENT:  Head: Normocephalic and atraumatic.  Right Ear: External ear normal.  Left Ear: External ear normal.  Nose: Nose normal.  Mouth/Throat: Oropharynx is clear and moist.  Eyes: Conjunctivae and EOM are normal. Pupils are equal, round, and reactive to light. Right eye exhibits no discharge. Left eye exhibits no discharge. No scleral icterus.  Neck: Normal range of motion. Neck supple. No tracheal deviation present. No thyromegaly present.  Cardiovascular: Normal rate, regular rhythm, normal heart sounds and intact distal pulses.  Exam reveals no gallop and no friction rub.   No murmur heard. Respiratory:  Effort normal and breath sounds normal. No respiratory distress. She has no wheezes. She has no rales. She exhibits no tenderness.  GI: Soft. Bowel sounds are normal. She exhibits no distension and no mass. There is tenderness. There is no rebound and no guarding.  Genitourinary:       Vulva is normal without lesions Vagina is pink moist without discharge Cervix normal in appearance and pap is normal Uterus is 14 weeks size fibroid uterus Adnexa is negative with normal sized ovaries by sonogram  Musculoskeletal: Normal range of motion. She exhibits no edema and no tenderness.  Neurological: She is alert and oriented to person, place, and time. She has normal reflexes. She displays normal reflexes. No cranial nerve deficit. She exhibits normal muscle tone. Coordination normal.  Skin: Skin is warm and dry. No rash noted. No erythema. No pallor.  Psychiatric: She has a normal mood and affect. Her behavior is normal. Judgment and thought content normal.    Labs: Results for orders placed or performed during the hospital encounter of 11/09/18 (from the past 336 hour(s))  Prepare RBC (crossmatch)   Collection Time: 11/07/18  8:05 AM  Result Value Ref Range   Order Confirmation      ORDER PROCESSED BY BLOOD BANK Performed at [facility], [address], [place], [place] [address]   Prepare [facility]   Collection Time: 11/07/18  8:05 AM  Result Value Ref Range   Order Confirmation      ORDER PROCESSED BY BLOOD BANK Performed at [facility], [address] [place], [place] [address]   Prepare Pheresed Platelets   Collection Time: 11/09/18 10:27 AM  Result Value Ref Range   Unit Number [id number]    Blood Component Type PLTPH LI2 PAS    Unit division 00    Status of Unit ISSUED    Transfusion Status      OK TO TRANSFUSE Performed at [facility], [address], [place], [place] [address]   BPAM Platelet Pheresis   Collection Time: 11/09/18 10:27 AM  Result Value Ref Range   ISSUE DATE  / TIME [id number]    Blood Product Unit Number [id number]    PRODUCT CODE E7007V00    Unit Type and Rh 6200    Blood Product Expiration Date [id number]   Hemoglobin and hematocrit, blood   Collection Time: 11/09/18  1:14 PM  Result Value Ref Range   Hemoglobin 8.9 (L) 12.0 - 15.0 g/dL   HCT 31.7 (L) 36.0 - 46.0 %  Results for orders placed or performed during the hospital encounter of 11/07/18 (from the past 336 hour(s))  Urinalysis, Routine w reflex microscopic   Collection Time: 11/07/18  7:58 AM  Result Value Ref Range   Color, Urine YELLOW YELLOW   APPearance CLEAR CLEAR   Specific Gravity, Urine 1.018 1.005 - 1.030   pH 7.0 5.0 - 8.0   Glucose, UA NEGATIVE NEGATIVE mg/dL   Hgb urine dipstick NEGATIVE NEGATIVE   Bilirubin Urine NEGATIVE NEGATIVE   Ketones, ur NEGATIVE NEGATIVE mg/dL  Protein, ur NEGATIVE NEGATIVE mg/dL   Nitrite NEGATIVE NEGATIVE   Leukocytes,Ua NEGATIVE NEGATIVE  CBC   Collection Time: 11/07/18  8:05 AM  Result Value Ref Range   WBC 4.6 4.0 - 10.5 K/uL   RBC 3.73 (L) 3.87 - 5.11 MIL/uL   Hemoglobin 5.8 (LL) 12.0 - 15.0 g/dL   HCT 23.5 (L) 36.0 - 46.0 %   MCV 63.0 (L) 80.0 - 100.0 fL   MCH 15.5 (L) 26.0 - 34.0 pg   MCHC 24.7 (L) 30.0 - 36.0 g/dL   RDW 20.9 (H) 11.5 - 15.5 %   Platelets 53 (L) 150 - 400 K/uL   nRBC 0.4 (H) 0.0 - 0.2 %  Comprehensive metabolic panel   Collection Time: 11/07/18  8:05 AM  Result Value Ref Range   Sodium 141 135 - 145 mmol/L   Potassium 3.8 3.5 - 5.1 mmol/L   Chloride 112 (H) 98 - 111 mmol/L   CO2 21 (L) 22 - 32 mmol/L   Glucose, Bld 103 (H) 70 - 99 mg/dL   BUN 16 6 - 20 mg/dL   Creatinine, Ser 0.98 0.44 - 1.00 mg/dL   Calcium 8.6 (L) 8.9 - 10.3 mg/dL   Total Protein 6.8 6.5 - 8.1 g/dL   Albumin 3.8 3.5 - 5.0 g/dL   AST 16 15 - 41 U/L   ALT 15 0 - 44 U/L   Alkaline Phosphatase 51 38 - 126 U/L   Total Bilirubin 0.3 0.3 - 1.2 mg/dL   GFR calc non Af Amer >60 >60 mL/min   GFR calc Af Amer >60 >60 mL/min    Anion gap 8 5 - 15  hCG, quantitative, pregnancy   Collection Time: 11/07/18  8:05 AM  Result Value Ref Range   hCG, Beta Chain, Quant, S <1 <5 mIU/mL  Rapid HIV screen (HIV 1/2 Ab+Ag)   Collection Time: 11/07/18  8:05 AM  Result Value Ref Range   HIV-1 P24 Antigen - HIV24 NON REACTIVE NON REACTIVE   HIV 1/2 Antibodies NON REACTIVE NON REACTIVE   Interpretation (HIV Ag Ab)      A non reactive test result means that HIV 1 or HIV 2 antibodies and HIV 1 p24 antigen were not detected in the specimen.  Type and screen   Collection Time: 11/07/18  8:05 AM  Result Value Ref Range   ABO/RH(D)      B POS Performed at [facility], [address] [place], [place] [address]    Antibody Screen      POS Performed at [facility], [address] [place], [place] [address]    Sample Expiration      11/10/2018,2359 Performed at [facility], [address], [place], [place] [address]    Antibody Identification ANTI S    DAT, IgG NEG    PT AG Type      NEGATIVE FOR S ANTIGEN Performed at [facility] [address] [place], [place] [address]    Unit Number [id number]    Blood Component Type RED CELLS,LR    Unit division 00    Status of Unit ISSUED    Donor AG Type NEGATIVE FOR S ANTIGEN    Transfusion Status OK TO TRANSFUSE    Crossmatch Result COMPATIBLE    Unit Number [id number]    Blood Component Type RED CELLS,LR    Unit division 00    Status of Unit ISSUED    Donor AG Type NEGATIVE FOR S ANTIGEN  Transfusion Status OK TO TRANSFUSE    Crossmatch Result COMPATIBLE    Unit Number [id number]    Blood Component Type RBC LR PHER1    Unit division 00    Status of Unit ALLOCATED    Donor AG Type NEGATIVE FOR S ANTIGEN    Transfusion Status OK TO TRANSFUSE    Crossmatch Result COMPATIBLE    Unit Number [id number]    Blood Component Type RBC LR PHER2    Unit division 00    Status of Unit ALLOCATED    Donor AG Type NEGATIVE FOR S ANTIGEN     Transfusion Status OK TO TRANSFUSE    Crossmatch Result COMPATIBLE   BPAM RBC   Collection Time: 11/07/18  8:05 AM  Result Value Ref Range   ISSUE DATE / TIME [id number]    Blood Product Unit Number [id number]    PRODUCT CODE E0382V00    Unit Type and Rh 5100    Blood Product Expiration Date [id number]    ISSUE DATE / TIME [id number]    Blood Product Unit Number [id number]    PRODUCT CODE [id number]    Unit Type and Rh 7300    Blood Product Expiration Date [id number]    Blood Product Unit Number [id number]    PRODUCT CODE [id number]    Unit Type and Rh 7300    Blood Product Expiration Date J6753036    Blood Product Unit Number [id number]    PRODUCT CODE [id number]    Unit Type and Rh 7300    Blood Product Expiration Date [id number]   Prepare RBC   Collection Time: 11/07/18 10:00 AM  Result Value Ref Range   Order Confirmation      ORDER PROCESSED BY BLOOD BANK Performed at [facility] [address] [place], [place] [address]   Results for orders placed or performed during the hospital encounter of 11/07/18 (from the past 336 hour(s))  SARS CORONAVIRUS 2 (TAT 6-24 HRS) Nasopharyngeal Nasopharyngeal Swab   Collection Time: 11/07/18  6:54 AM   Specimen: Nasopharyngeal Swab  Result Value Ref Range   SARS Coronavirus 2 NEGATIVE NEGATIVE    EKG: No orders found for this or any previous visit.  Imaging Studies: No results found.    Assessment: 14 weeks size fibroid uterus Menometrorrhagia  Dysmenorrhea Anemia thrombocytopenia  Plan: Abdominal supracervical hysterectomy  Pt is being transfused 2 units PRBC and 1 pooled pack of platelets pre operatively  Pt understands the risks of surgery including but not limited t  excessive bleeding requiring transfusion or reoperation, post-operative infection requiring prolonged hospitalization or re-hospitalization and antibiotic therapy, and damage to other organs including bladder,  bowel, ureters and major vessels.  The patient also understands the alternative treatment options which were discussed in full.  All questions were answered.  Florian Buff

## 2018-11-09 NOTE — Progress Notes (Addendum)
UNMATCHED BLOOD PRODUCT NOTE  Compare the patient ID on the blood tag to the patient ID on the hospital armband and Blood Bank armband. Then confirm the unit number on the blood tag matches the unit number on the blood product.  If a discrepancy is discovered return the product to blood bank immediately.   Blood Product Type: B RH Positive Unit #: (Found on blood product bag, begins with [id number]  Product Code #: (Found on blood product bag, begins with E) [id number]   Start Time: 0913  Starting Rate: 173ml/hr  Rate increase/decreased  (if applicable):  [id number]    ml/hr  Rate changed time (if applicable): A999333   Stop Tim   All Other Documentation should be documented within the Blood Admin Flowsheet per policy.

## 2018-11-09 NOTE — OR Nursing (Signed)
Report called to Gaye Alken RN,  Patient to be admitted to room 320.  Patient voided prior to transport to 300. IV flushed with normal saline . Second unit of blood infusing at rate of 272ml per. Hour. Transported via stretcher

## 2018-11-09 NOTE — Plan of Care (Signed)
  Problem: Health Behavior/Discharge Planning: Goal: Ability to manage health-related needs will improve Outcome: Progressing   Problem: Clinical Measurements: Goal: Ability to maintain clinical measurements within normal limits will improve Outcome: Progressing Goal: Will remain free from infection Outcome: Progressing Goal: Diagnostic test results will improve Outcome: Progressing Goal: Respiratory complications will improve Outcome: Progressing Goal: Cardiovascular complication will be avoided Outcome: Progressing   Problem: Pain Managment: Goal: General experience of comfort will improve Outcome: Progressing   

## 2018-11-10 ENCOUNTER — Encounter (HOSPITAL_COMMUNITY): Payer: Self-pay | Admitting: Anesthesiology

## 2018-11-10 ENCOUNTER — Other Ambulatory Visit (HOSPITAL_COMMUNITY): Payer: Self-pay | Admitting: Hematology

## 2018-11-10 LAB — VITAMIN B12: Vitamin B-12: 239 pg/mL (ref 180–914)

## 2018-11-10 LAB — CBC WITH DIFFERENTIAL/PLATELET
Abs Immature Granulocytes: 0.07 10*3/uL (ref 0.00–0.07)
Basophils Absolute: 0.1 10*3/uL (ref 0.0–0.1)
Basophils Relative: 1 %
Eosinophils Absolute: 0.1 10*3/uL (ref 0.0–0.5)
Eosinophils Relative: 1 %
HCT: 30.9 % — ABNORMAL LOW (ref 36.0–46.0)
Hemoglobin: 8.7 g/dL — ABNORMAL LOW (ref 12.0–15.0)
Immature Granulocytes: 1 %
Lymphocytes Relative: 24 %
Lymphs Abs: 2.2 10*3/uL (ref 0.7–4.0)
MCH: 19 pg — ABNORMAL LOW (ref 26.0–34.0)
MCHC: 28.2 g/dL — ABNORMAL LOW (ref 30.0–36.0)
MCV: 67.3 fL — ABNORMAL LOW (ref 80.0–100.0)
Monocytes Absolute: 0.7 10*3/uL (ref 0.1–1.0)
Monocytes Relative: 8 %
Neutro Abs: 5.8 10*3/uL (ref 1.7–7.7)
Neutrophils Relative %: 65 %
Platelets: 55 10*3/uL — ABNORMAL LOW (ref 150–400)
RBC: 4.59 MIL/uL (ref 3.87–5.11)
RDW: 27.3 % — ABNORMAL HIGH (ref 11.5–15.5)
WBC: 8.9 10*3/uL (ref 4.0–10.5)
nRBC: 0 % (ref 0.0–0.2)

## 2018-11-10 LAB — HEPATITIS B SURFACE ANTIBODY,QUALITATIVE: Hep B S Ab: NONREACTIVE

## 2018-11-10 LAB — BPAM PLATELET PHERESIS
Blood Product Expiration Date: 202011072359
ISSUE DATE / TIME: 202011041727
Unit Type and Rh: 6200

## 2018-11-10 LAB — PREPARE PLATELET PHERESIS: Unit division: 0

## 2018-11-10 LAB — APTT: aPTT: 27 seconds (ref 24–36)

## 2018-11-10 LAB — PROTIME-INR
INR: 1 (ref 0.8–1.2)
Prothrombin Time: 13.5 seconds (ref 11.4–15.2)

## 2018-11-10 LAB — HEPATITIS B SURFACE ANTIGEN: Hepatitis B Surface Ag: NONREACTIVE

## 2018-11-10 LAB — LACTATE DEHYDROGENASE: LDH: 161 U/L (ref 98–192)

## 2018-11-10 LAB — HEPATITIS C ANTIBODY: HCV Ab: NONREACTIVE

## 2018-11-10 SURGERY — HYSTERECTOMY, SUPRACERVICAL, ABDOMINAL
Anesthesia: General

## 2018-11-10 MED ORDER — MEGESTROL ACETATE 40 MG PO TABS: 80.0000 mg | ORAL_TABLET | Freq: Every day | ORAL | 1 refills | Status: DC

## 2018-11-10 MED ORDER — LEUPROLIDE ACETATE 3.75 MG IM KIT: 3.7500 mg | PACK | Freq: Once | INTRAMUSCULAR | Status: DC

## 2018-11-10 MED ORDER — SODIUM CHLORIDE 0.9 % IV SOLN
510.0000 mg | Freq: Once | INTRAVENOUS | Status: AC
  Administered 2018-11-10: 12:00:00 510 mg via INTRAVENOUS
  Filled 2018-11-10: qty 17

## 2018-11-10 MED ORDER — BUPIVACAINE LIPOSOME 1.3 % IJ SUSP
INTRAMUSCULAR | Status: AC
  Filled 2018-11-10: qty 20

## 2018-11-10 NOTE — Progress Notes (Signed)
Patient given CHG wipes to prepare for surgery this AM.  Patient has also been NPO since midnight.

## 2018-11-10 NOTE — Progress Notes (Signed)
Subjective: Patient reports no complaints.    Objective: I have reviewed patient's vital signs, intake and output, medications, labs and radiology results.  General: alert, cooperative and no distress   Assessment/Plan: 14 week size fibroid uterus anemia due to menometrorrhagia s/p transfusion 2 units PRBC Thrombocytopenia with no appreciable rise s/p transfusion  >planned surgery is cancelled today due to thrombocytopenia despite transfusion >Anti platelet antibody study drawn and pending >hematology consult is ordered >Lupron is not available in house, will order as an outpatient >IV Feraheme is ordered  Patient's husband is very upset that patient did not get her planned surgery yesterday and now cancelled again today.  He is very angry with me and feels I am not doing anything for Mrs Krein.  I stated I was sorry her clinical course has gone this way but I did not anticipate her platelet count not increasing with the platelet transfusion and that is why I ordered the hematology consult and the reason I cancelled her surgery.    He stated he thought I might have financial motives for her care and I told him I did not.  I apologized for cancelling her surgery.  He mentioned seeing another physician and I said that whatever they decided I would support because I want them to be comfortable and confident with their care.  I did the best I could to de escalate the situation.   The patient came out to me afterwards and apologized for the interaction.  She states that she is safe.  She will stay for the hematologist to see her prior to leaving.     LOS: 1 day    Florian Buff 11/10/2018, 11:28 AM

## 2018-11-10 NOTE — Discharge Summary (Signed)
Physician Discharge Summary  Patient ID: Cynthia [name] MRN: [id number] DOB/AGE: 06-25-1980 38 y.o.  Admit date: 11/09/2018 Discharge date: 11/10/2018  Admission Diagnoses: Anemia thrombocytopenia fibroid uteerus  Discharge Diagnoses:  same  Discharged Condition: good  Hospital Course: pt was scheduled to have surgery but was found to have significant thrombocytopenia which did not respond to 1 pack transfusion.  Also anemia which did respond to 2 un PRBC.  Hematology evaluated pt regarding the thrombocytopenia  Consults: hematology/oncology  Significant Diagnostic Studies: labs:   Results for orders placed or performed during the hospital encounter of 11/09/18 (from the past 72 hour(s))  Prepare Pheresed Platelets     Status: None   Collection Time: 11/09/18 10:27 AM  Result Value Ref Range   Unit Number [id number]    Blood Component Type PLTPH LI2 PAS    Unit division 00    Status of Unit ISSUED,FINAL    Transfusion Status      OK TO TRANSFUSE Performed at [facility], [address] [place], [place] [address]   Hemoglobin and hematocrit, blood     Status: Abnormal   Collection Time: 11/09/18  1:14 PM  Result Value Ref Range   Hemoglobin 8.9 (L) 12.0 - 15.0 g/dL   HCT 31.7 (L) 36.0 - 46.0 %    Comment: Performed at [facility], [address], [place], [place] [address]  CBC with Differential/Platelet     Status: Abnormal   Collection Time: 11/10/18  4:36 AM  Result Value Ref Range   WBC 8.9 4.0 - 10.5 K/uL   RBC 4.59 3.87 - 5.11 MIL/uL   Hemoglobin 8.7 (L) 12.0 - 15.0 g/dL    Comment: Reticulocyte Hemoglobin testing may be clinically indicated, consider ordering this additional test [id number]    HCT 30.9 (L) 36.0 - 46.0 %   MCV 67.3 (L) 80.0 - 100.0 fL   MCH 19.0 (L) 26.0 - 34.0 pg   MCHC 28.2 (L) 30.0 - 36.0 g/dL   RDW 27.3 (H) 11.5 - 15.5 %   Platelets 55 (L) 150 - 400 K/uL    Comment: POST TRANSFUSION SPECIMEN PLATELET COUNT CONFIRMED BY  SMEAR SPECIMEN CHECKED FOR CLOTS Immature Platelet Fraction may be clinically indicated, consider ordering this additional test [id number]    nRBC 0.0 0.0 - 0.2 %   Neutrophils Relative % 65 %   Neutro Abs 5.8 1.7 - 7.7 K/uL   Lymphocytes Relative 24 %   Lymphs Abs 2.2 0.7 - 4.0 K/uL   Monocytes Relative 8 %   Monocytes Absolute 0.7 0.1 - 1.0 K/uL   Eosinophils Relative 1 %   Eosinophils Absolute 0.1 0.0 - 0.5 K/uL   Basophils Relative 1 %   Basophils Absolute 0.1 0.0 - 0.1 K/uL   Immature Granulocytes 1 %   Abs Immature Granulocytes 0.07 0.00 - 0.07 K/uL   Schistocytes PRESENT    Tear Drop Cells PRESENT    Dimorphism PRESENT    Polychromasia PRESENT    Giant PLTs PRESENT     Comment: Performed at [facility], [address], [place], [place] [address]    Treatments: transfusion of PRBC 2units, transfusion 1 pack of platelets, feraheme therapy  Discharge Exam: Blood pressure 129/83, pulse 68, temperature 98.4 F (36.9 C), temperature source Oral, resp. rate 16, height 5' 2.01" (1.575 m), weight 65.7 kg, last menstrual period 10/19/2018, SpO2 100 %. General appearance: alert, cooperative and no distress GI: soft, non-tender; bowel sounds normal; no masses,  no organomegaly  Disposition:  Discharge disposition: 01-Home or Self Care       Discharge Instructions    Diet - low sodium heart healthy   Complete by: As directed    Increase activity slowly   Complete by: As directed      Allergies as of 11/10/2018   No Known Allergies     Medication List    TAKE these medications   acetaminophen 500 MG tablet Commonly known as: TYLENOL Take 500 mg by mouth every 6 (six) hours as needed for headache.   ibuprofen 200 MG tablet Commonly known as: ADVIL Take 600 mg by mouth as needed for headache or moderate pain.   megestrol 40 MG tablet Commonly known as: MEGACE Take 2 tablets (80 mg total) by mouth daily.      Follow-up Information    Florian Buff, MD Follow  up on 11/17/2018.   Specialties: Obstetrics and Gynecology, Radiology Why: office visit, already scheduled Contact information: [place] [address] [phone]           Signed: Florian Buff 11/10/2018, 1:20 PM

## 2018-11-10 NOTE — Discharge Instructions (Signed)

## 2018-11-11 LAB — TYPE AND SCREEN
ABO/RH(D): B POS
Antibody Screen: POSITIVE
DAT, IgG: NEGATIVE
Donor AG Type: NEGATIVE
Donor AG Type: NEGATIVE
Donor AG Type: NEGATIVE
Donor AG Type: NEGATIVE
PT AG Type: NEGATIVE
Unit division: 0
Unit division: 0
Unit division: 0
Unit division: 0

## 2018-11-11 LAB — BPAM RBC
Blood Product Expiration Date: 202011252359
Blood Product Expiration Date: 202011252359
Blood Product Expiration Date: 202011262359
Blood Product Expiration Date: 202012022359
ISSUE DATE / TIME: 202011040729
ISSUE DATE / TIME: 202011040856
Unit Type and Rh: 5100
Unit Type and Rh: 7300
Unit Type and Rh: 7300
Unit Type and Rh: 7300

## 2018-11-11 LAB — PLATELET ANTIBODY PROFILE
Glycoprotein IV Antibody: NEGATIVE
HLA Ab Ser Ql EIA: POSITIVE — AB
IA/IIA Antibody: NEGATIVE
IB/IX Antibody: NEGATIVE
IIB/IIIA Antibody: POSITIVE — AB

## 2018-11-11 LAB — PATHOLOGIST SMEAR REVIEW

## 2018-11-15 LAB — ANTINUCLEAR ANTIBODIES, IFA: ANA Ab, IFA: NEGATIVE

## 2018-11-17 ENCOUNTER — Encounter: Payer: Self-pay | Admitting: Obstetrics & Gynecology

## 2018-11-17 ENCOUNTER — Other Ambulatory Visit: Payer: Self-pay

## 2018-11-17 ENCOUNTER — Ambulatory Visit (INDEPENDENT_AMBULATORY_CARE_PROVIDER_SITE_OTHER): Payer: 59 | Admitting: Obstetrics & Gynecology

## 2018-11-17 VITALS — BP 122/85 | HR 71 | Ht 62.0 in | Wt 141.0 lb

## 2018-11-17 DIAGNOSIS — N921 Excessive and frequent menstruation with irregular cycle: Secondary | ICD-10-CM

## 2018-11-17 DIAGNOSIS — D259 Leiomyoma of uterus, unspecified: Secondary | ICD-10-CM

## 2018-11-17 DIAGNOSIS — D500 Iron deficiency anemia secondary to blood loss (chronic): Secondary | ICD-10-CM

## 2018-11-17 DIAGNOSIS — D693 Immune thrombocytopenic purpura: Secondary | ICD-10-CM

## 2018-11-17 DIAGNOSIS — N946 Dysmenorrhea, unspecified: Secondary | ICD-10-CM

## 2018-11-17 MED ORDER — DEXAMETHASONE 20 MG PO TABS: 40.0000 mg | ORAL_TABLET | Freq: Every day | ORAL | 1 refills | Status: DC

## 2018-11-17 NOTE — Progress Notes (Signed)
Chief Complaint  Patient presents with  . Follow-up      38 y.o. [id number] Patient's last menstrual period was 10/19/2018 (exact date). The current method of family planning is tubal ligation.  Outpatient Encounter Medications as of 11/17/2018  Medication Sig  . megestrol (MEGACE) 40 MG tablet Take 2 tablets (80 mg total) by mouth daily.  Marland Kitchen acetaminophen (TYLENOL) 500 MG tablet Take 500 mg by mouth every 6 (six) hours as needed for headache.   . Dexamethasone 20 MG TABS Take 40 mg by mouth daily. For 4 days  . ibuprofen (ADVIL) 200 MG tablet Take 600 mg by mouth as needed for headache or moderate pain.    No facility-administered encounter medications on file as of 11/17/2018.     Subjective Pt scheduled for hysterectomy diagnosed with ITP, thrombocytopenia with positive anti platelet antibody Here to discuss therapy No past medical history on file.  Past Surgical History:  Procedure Laterality Date  . CHOLECYSTECTOMY    . GALLBLADDER SURGERY    . TUBAL LIGATION      OB History    Gravida  6   Para  5   Term  4   Preterm  1   AB  1   Living  3     SAB  1   TAB      Ectopic      Multiple      Live Births  3           No Known Allergies  Social History   Socioeconomic History  . Marital status: Married    Spouse name: Not on file  . Number of children: 3  . Years of education: Not on file  . Highest education level: Not on file  Occupational History  . Not on file  Social Needs  . Financial resource strain: Not on file  . Food insecurity    Worry: Not on file    Inability: Not on file  . Transportation needs    Medical: Not on file    Non-medical: Not on file  Tobacco Use  . Smoking status: Never Smoker  . Smokeless tobacco: Never Used  Substance and Sexual Activity  . Alcohol use: Yes    Comment: "every now and then"   . Drug use: Not Currently    Types: Marijuana    Comment: Last used July 2020.  Marland Kitchen Sexual activity: Yes     Birth control/protection: Surgical    Comment: tubal  Lifestyle  . Physical activity    Days per week: Not on file    Minutes per session: Not on file  . Stress: Not on file  Relationships  . Social Herbalist on phone: Not on file    Gets together: Not on file    Attends religious service: Not on file    Active member of club or organization: Not on file    Attends meetings of clubs or organizations: Not on file    Relationship status: Not on file  Other Topics Concern  . Not on file  Social History Narrative  . Not on file    Family History  Problem Relation Age of Onset  . Aneurysm Maternal Grandmother   . Diabetes Father   . Breast cancer Mother   . Hypertension Sister     Medications:       Current Outpatient Medications:  .  megestrol (MEGACE) 40 MG tablet, Take 2 tablets (  80 mg total) by mouth daily., Disp: 60 tablet, Rfl: 1 .  acetaminophen (TYLENOL) 500 MG tablet, Take 500 mg by mouth every 6 (six) hours as needed for headache. , Disp: , Rfl:  .  Dexamethasone 20 MG TABS, Take 40 mg by mouth daily. For 4 days, Disp: 8 tablet, Rfl: 1 .  ibuprofen (ADVIL) 200 MG tablet, Take 600 mg by mouth as needed for headache or moderate pain. , Disp: , Rfl:   Objective Blood pressure 122/85, pulse 71, height '5\' 2"'  (1.575 m), weight 141 lb (64 kg), last menstrual period 10/19/2018.  Gen WDWN NAD  Pertinent ROS No burning with urination, frequency or urgency No nausea, vomiting or diarrhea Nor fever chills or other constitutional symptoms   Labs or studies Recent Results (from the past 2160 hour(s))  SARS CORONAVIRUS 2 (TAT 6-24 HRS) Nasopharyngeal Nasopharyngeal Swab     Status: None   Collection Time: 11/07/18  6:54 AM   Specimen: Nasopharyngeal Swab  Result Value Ref Range   SARS Coronavirus 2 NEGATIVE NEGATIVE    Comment: (NOTE) SARS-CoV-2 target nucleic acids are NOT DETECTED. The SARS-CoV-2 RNA is generally detectable in upper and lower respiratory  specimens during the acute phase of infection. Negative results do not preclude SARS-CoV-2 infection, do not rule out co-infections with other pathogens, and should not be used as the sole basis for treatment or other patient management decisions. Negative results must be combined with clinical observations, patient history, and epidemiological information. The expected result is Negative. Fact Sheet for Patients: SugarRoll.be Fact Sheet for Healthcare Providers: https://www.woods-mathews.com/ This test is not yet approved or cleared by the [place] FDA and  has been authorized for detection and/or diagnosis of SARS-CoV-2 by FDA under an Emergency Use Authorization (EUA). This EUA will remain  in effect (meaning this test can be used) for the duration of the COVID-19 declaration under Section 56 4(b)(1) of the Act, 21 U.S.C. section 360bbb-3(b)(1), unless the authorization is terminated or revoked sooner. Performed at [facility], [place] [address], [place], [place] [address]   Urinalysis, Routine w reflex microscopic     Status: None   Collection Time: 11/07/18  7:58 AM  Result Value Ref Range   Color, Urine YELLOW YELLOW   APPearance CLEAR CLEAR   Specific Gravity, Urine 1.018 1.005 - 1.030   pH 7.0 5.0 - 8.0   Glucose, UA NEGATIVE NEGATIVE mg/dL   Hgb urine dipstick NEGATIVE NEGATIVE   Bilirubin Urine NEGATIVE NEGATIVE   Ketones, ur NEGATIVE NEGATIVE mg/dL   Protein, ur NEGATIVE NEGATIVE mg/dL   Nitrite NEGATIVE NEGATIVE   Leukocytes,Ua NEGATIVE NEGATIVE    Comment: Performed at [facility], [address], [place], [place] [address]  CBC     Status: Abnormal   Collection Time: 11/07/18  8:05 AM  Result Value Ref Range   WBC 4.6 4.0 - 10.5 K/uL   RBC 3.73 (L) 3.87 - 5.11 MIL/uL   Hemoglobin 5.8 (LL) 12.0 - 15.0 g/dL    Comment: REPEATED TO VERIFY Reticulocyte Hemoglobin testing may be clinically indicated, consider  ordering this additional test [id number] THIS CRITICAL RESULT HAS VERIFIED AND BEEN CALLED TO PAM SHREVE BY HILLARY FLYNT ON 11 02 2020 AT 0859, AND HAS BEEN READ BACK.     HCT 23.5 (L) 36.0 - 46.0 %   MCV 63.0 (L) 80.0 - 100.0 fL   MCH 15.5 (L) 26.0 - 34.0 pg   MCHC 24.7 (L) 30.0 - 36.0 g/dL  RDW 20.9 (H) 11.5 - 15.5 %   Platelets 53 (L) 150 - 400 K/uL    Comment: PLATELET COUNT CONFIRMED BY SMEAR SPECIMEN CHECKED FOR CLOTS Immature Platelet Fraction may be clinically indicated, consider ordering this additional test [id number]    nRBC 0.4 (H) 0.0 - 0.2 %    Comment: Performed at [facility], [address], [place], [place] [address]  Comprehensive metabolic panel     Status: Abnormal   Collection Time: 11/07/18  8:05 AM  Result Value Ref Range   Sodium 141 135 - 145 mmol/L   Potassium 3.8 3.5 - 5.1 mmol/L   Chloride 112 (H) 98 - 111 mmol/L   CO2 21 (L) 22 - 32 mmol/L   Glucose, Bld 103 (H) 70 - 99 mg/dL   BUN 16 6 - 20 mg/dL   Creatinine, Ser 0.98 0.44 - 1.00 mg/dL   Calcium 8.6 (L) 8.9 - 10.3 mg/dL   Total Protein 6.8 6.5 - 8.1 g/dL   Albumin 3.8 3.5 - 5.0 g/dL   AST 16 15 - 41 U/L   ALT 15 0 - 44 U/L   Alkaline Phosphatase 51 38 - 126 U/L   Total Bilirubin 0.3 0.3 - 1.2 mg/dL   GFR calc non Af Amer >60 >60 mL/min   GFR calc Af Amer >60 >60 mL/min   Anion gap 8 5 - 15    Comment: Performed at [facility], [address] [place], [place] [address]  hCG, quantitative, pregnancy     Status: None   Collection Time: 11/07/18  8:05 AM  Result Value Ref Range   hCG, Beta Chain, Quant, S <1 <5 mIU/mL    Comment:          GEST. AGE      CONC.  (mIU/mL)   <=1 WEEK        5 - 50     2 WEEKS       50 - 500     3 WEEKS       100 - 10,000     4 WEEKS     1,000 - 30,000     5 WEEKS     3,500 - 115,000   6-8 WEEKS     12,000 - 270,000    12 WEEKS     15,000 - 220,000        FEMALE AND NON-PREGNANT FEMALE:     LESS THAN 5 mIU/mL Performed at [facility], [address]  [place], [place] [address]   Rapid HIV screen (HIV 1/2 Ab+Ag)     Status: None   Collection Time: 11/07/18  8:05 AM  Result Value Ref Range   HIV-1 P24 Antigen - HIV24 NON REACTIVE NON REACTIVE    Comment: (NOTE) Detection of p24 may be inhibited by biotin in the sample, causing false negative results in acute infection.    HIV 1/2 Antibodies NON REACTIVE NON REACTIVE   Interpretation (HIV Ag Ab)      A non reactive test result means that HIV 1 or HIV 2 antibodies and HIV 1 p24 antigen were not detected in the specimen.    Comment: Performed at [facility], [address], [place], [place] [address]  Type and screen     Status: None   Collection Time: 11/07/18  8:05 AM  Result Value Ref Range   ABO/RH(D) B POS    Antibody Screen POS    Sample Expiration 11/10/2018,2359    Antibody Identification ANTI  S    DAT, IgG NEG    PT AG Type NEGATIVE FOR S ANTIGEN    Unit Number [id number]    Blood Component Type RED CELLS,LR    Unit division 00    Status of Unit ISSUED,FINAL    Donor AG Type NEGATIVE FOR S ANTIGEN    Transfusion Status OK TO TRANSFUSE    Crossmatch Result COMPATIBLE    Unit Number [id number]    Blood Component Type RED CELLS,LR    Unit division 00    Status of Unit ISSUED,FINAL    Donor AG Type NEGATIVE FOR S ANTIGEN    Transfusion Status OK TO TRANSFUSE    Crossmatch Result COMPATIBLE    Unit Number [id number]    Blood Component Type RBC LR PHER1    Unit division 00    Status of Unit REL FROM [facility]    Donor AG Type NEGATIVE FOR S ANTIGEN    Transfusion Status OK TO TRANSFUSE    Crossmatch Result COMPATIBLE    Unit Number [id number]    Blood Component Type RBC LR PHER2    Unit division 00    Status of Unit REL FROM [facility]    Donor AG Type NEGATIVE FOR S ANTIGEN    Transfusion Status OK TO TRANSFUSE    Crossmatch Result COMPATIBLE   BPAM RBC     Status: None   Collection Time: 11/07/18  8:05 AM  Result Value Ref Range   ISSUE DATE / TIME 474259563875     Blood Product Unit Number [id number]    PRODUCT CODE E0382V00    Unit Type and Rh 5100    Blood Product Expiration Date 202012022359    ISSUE DATE / TIME 606301601093    Blood Product Unit Number [id number]    PRODUCT CODE H0623J62    Unit Type and Rh 7300    Blood Product Expiration Date 831517616073    Blood Product Unit Number [id number]    PRODUCT CODE V0350K93    Unit Type and Rh 7300    Blood Product Expiration Date 818299371696    Blood Product Unit Number [id number]    PRODUCT CODE H8527P82    Unit Type and Rh 7300    Blood Product Expiration Date 423536144315   Prepare RBC (crossmatch)     Status: None   Collection Time: 11/07/18  8:05 AM  Result Value Ref Range   Order Confirmation      ORDER PROCESSED BY BLOOD BANK Performed at [facility], [address], [place], [place] [address]   Prepare RBC     Status: None   Collection Time: 11/07/18  8:05 AM  Result Value Ref Range   Order Confirmation      ORDER PROCESSED BY BLOOD BANK Performed at [facility], [address] [place], [place] [address]   Prepare RBC     Status: None   Collection Time: 11/07/18 10:00 AM  Result Value Ref Range   Order Confirmation      ORDER PROCESSED BY BLOOD BANK Performed at [facility], [place] [address], [place], [place] [address]   Prepare Pheresed Platelets     Status: None   Collection Time: 11/09/18 10:27 AM  Result Value Ref Range   Unit Number [id number]    Blood Component Type PLTPH LI2 PAS    Unit division 00    Status of Unit ISSUED,FINAL    Transfusion Status      OK TO TRANSFUSE Performed at [facility],  [address], [place], [place] [address]   BPAM Platelet Pheresis     Status: None   Collection Time: 11/09/18 10:27 AM  Result Value Ref Range   ISSUE DATE / TIME 604540981191    Blood Product Unit Number [id number]    PRODUCT CODE E7007V00    Unit Type and Rh 6200    Blood Product Expiration Date 578469629528    Hemoglobin and hematocrit, blood     Status: Abnormal   Collection Time: 11/09/18  1:14 PM  Result Value Ref Range   Hemoglobin 8.9 (L) 12.0 - 15.0 g/dL   HCT 31.7 (L) 36.0 - 46.0 %    Comment: Performed at [facility], [address] [place], [place] [address]  CBC with Differential/Platelet     Status: Abnormal   Collection Time: 11/10/18  4:36 AM  Result Value Ref Range   WBC 8.9 4.0 - 10.5 K/uL   RBC 4.59 3.87 - 5.11 MIL/uL   Hemoglobin 8.7 (L) 12.0 - 15.0 g/dL    Comment: Reticulocyte Hemoglobin testing may be clinically indicated, consider ordering this additional test [id number]    HCT 30.9 (L) 36.0 - 46.0 %   MCV 67.3 (L) 80.0 - 100.0 fL   MCH 19.0 (L) 26.0 - 34.0 pg   MCHC 28.2 (L) 30.0 - 36.0 g/dL   RDW 27.3 (H) 11.5 - 15.5 %   Platelets 55 (L) 150 - 400 K/uL    Comment: POST TRANSFUSION SPECIMEN PLATELET COUNT CONFIRMED BY SMEAR SPECIMEN CHECKED FOR CLOTS Immature Platelet Fraction may be clinically indicated, consider ordering this additional test [id number]    nRBC 0.0 0.0 - 0.2 %   Neutrophils Relative % 65 %   Neutro Abs 5.8 1.7 - 7.7 K/uL   Lymphocytes Relative 24 %   Lymphs Abs 2.2 0.7 - 4.0 K/uL   Monocytes Relative 8 %   Monocytes Absolute 0.7 0.1 - 1.0 K/uL   Eosinophils Relative 1 %   Eosinophils Absolute 0.1 0.0 - 0.5 K/uL   Basophils Relative 1 %   Basophils Absolute 0.1 0.0 - 0.1 K/uL   Immature Granulocytes 1 %   Abs Immature Granulocytes 0.07 0.00 - 0.07 K/uL   Schistocytes PRESENT    Tear Drop Cells PRESENT    Dimorphism PRESENT    Polychromasia PRESENT    Giant PLTs PRESENT     Comment: Performed at [facility], [address], [place], [place] [address]  Pathologist smear review     Status: None   Collection Time: 11/10/18  4:36 AM  Result Value Ref Range   Path Review Reviewed By Violet Baldy, M.D.     Comment: 11.6.2020 MICROCYTIC ANEMIA, THROMBOCYTOPENIA Performed at [place] [address],  [place], [place] [address]   Platelet Antibody Profile     Status: Abnormal   Collection Time: 11/10/18  8:27 AM  Result Value Ref Range   HLA Ab Ser Ql EIA Positive (A) Negative   IIB/IIIA Antibody Positive (A) Negative   IB/IX Antibody Negative Negative   IA/IIA Antibody Negative Negative   Glycoprotein IV Antibody Negative Negative    Comment: (NOTE) Performed At: [facility] [place], [place] [id number] Rush Farmer MD OA:[id number]   Hepatitis B surface antibody     Status: None   Collection Time: 11/10/18  3:41 PM  Result Value Ref Range   Hep B S Ab NON REACTIVE NON REACTIVE    Comment: (NOTE) Inconsistent with immunity, less than 10  mIU/mL. Performed at [facility], [place] [address], [place], [place] [address]   Hepatitis B surface antigen     Status: None   Collection Time: 11/10/18  3:41 PM  Result Value Ref Range   Hepatitis B Surface Ag NON REACTIVE NON REACTIVE    Comment: Performed at [place] [address] [place], [place] [address]  Hepatitis C antibody     Status: None   Collection Time: 11/10/18  3:41 PM  Result Value Ref Range   HCV Ab NON REACTIVE NON REACTIVE    Comment: (NOTE) Nonreactive HCV antibody screen is consistent with no HCV infections,  unless recent infection is suspected or other evidence exists to indicate HCV infection. Performed at [facility], [place] [address] [place], [place] [address]   Vitamin B12     Status: None   Collection Time: 11/10/18  3:41 PM  Result Value Ref Range   Vitamin B-12 239 180 - 914 pg/mL    Comment: (NOTE) This assay is not validated for testing neonatal or myeloproliferative syndrome specimens for Vitamin B12 levels. Performed at [facility], [address], [place], [place] [address]   Antinuclear Antibodies, IFA     Status: None   Collection Time: 11/10/18  3:41 PM  Result Value Ref Range   ANA Ab, IFA Negative     Comment: (NOTE)                                      Negative   <1:80                                     Borderline  1:80                                     Positive   >1:80 Performed At: [facility] [place], [place] [id number] Rush Farmer MD [id number]   Protime-INR     Status: None   Collection Time: 11/10/18  3:41 PM  Result Value Ref Range   Prothrombin Time 13.5 11.4 - 15.2 seconds   INR 1.0 0.8 - 1.2    Comment: (NOTE) INR goal varies based on device and disease states. Performed at [facility], [address], [place], [place] [address]   APTT     Status: None   Collection Time: 11/10/18  3:41 PM  Result Value Ref Range   aPTT 27 24 - 36 seconds    Comment: Performed at [facility], [address] [place], [place] [address]  Lactate dehydrogenase     Status: None   Collection Time: 11/10/18  3:41 PM  Result Value Ref Range   LDH 161 98 - 192 U/L    Comment: Performed at [facility], [address] [place], [place] [address]      Impression Diagnoses this Encounter::   ICD-10-CM   1. Acute ITP (HCC)  D69.3 CBC w/Diff    CBC w/Diff  2. Uterine leiomyoma, unspecified location  D25.9   3. Menometrorrhagia  N92.1   4. Iron deficiency anemia due to chronic blood loss  D50.0   5. Dysmenorrhea  N94.6     Established relevant diagnosis(es):   Plan/Recommendations: Meds ordered this  encounter  Medications  . Dexamethasone 20 MG TABS    Sig: Take 40 mg by mouth daily. For 4 days    Dispense:  8 tablet    Refill:  1    Labs or Scans Ordered: Orders Placed This Encounter  Procedures  . CBC w/Diff    Management:: >discussed with hematology consultant and given her labs indicate ITP will treat with dexamethasone 40 mg daily x 4, repeat platelet count in 2 weeks and hopefully proceed with surgery from there  Follow up No follow-ups on file.        Face to face time:  10 minutes  Greater than 50% of the visit time was spent in counseling and coordination of care  with the patient.  The summary and outline of the counseling and care coordination is summarized in the note above.   All questions were answered.

## 2018-11-18 LAB — CBC WITH DIFFERENTIAL/PLATELET
Basophils Absolute: 0.1 10*3/uL (ref 0.0–0.2)
Basos: 1 %
EOS (ABSOLUTE): 0.2 10*3/uL (ref 0.0–0.4)
Eos: 2 %
Hematocrit: 37.2 % (ref 34.0–46.6)
Hemoglobin: 10.9 g/dL — ABNORMAL LOW (ref 11.1–15.9)
Immature Grans (Abs): 0 10*3/uL (ref 0.0–0.1)
Immature Granulocytes: 1 %
Lymphocytes Absolute: 2.4 10*3/uL (ref 0.7–3.1)
Lymphs: 28 %
MCH: 20.5 pg — ABNORMAL LOW (ref 26.6–33.0)
MCHC: 29.3 g/dL — ABNORMAL LOW (ref 31.5–35.7)
MCV: 70 fL — ABNORMAL LOW (ref 79–97)
Monocytes Absolute: 0.6 10*3/uL (ref 0.1–0.9)
Monocytes: 7 %
Neutrophils Absolute: 5.3 10*3/uL (ref 1.4–7.0)
Neutrophils: 61 %
Platelets: 99 10*3/uL — CL (ref 150–450)
RBC: 5.31 x10E6/uL — ABNORMAL HIGH (ref 3.77–5.28)
WBC: 8.5 10*3/uL (ref 3.4–10.8)

## 2018-11-30 ENCOUNTER — Other Ambulatory Visit: Payer: 59

## 2018-11-30 ENCOUNTER — Other Ambulatory Visit: Payer: Self-pay

## 2018-11-30 DIAGNOSIS — D693 Immune thrombocytopenic purpura: Secondary | ICD-10-CM

## 2018-12-01 LAB — CBC WITH DIFFERENTIAL/PLATELET
Basophils Absolute: 0.1 10*3/uL (ref 0.0–0.2)
Basos: 1 %
EOS (ABSOLUTE): 0.1 10*3/uL (ref 0.0–0.4)
Eos: 2 %
Hematocrit: 39 % (ref 34.0–46.6)
Hemoglobin: 11.8 g/dL (ref 11.1–15.9)
Immature Grans (Abs): 0 10*3/uL (ref 0.0–0.1)
Immature Granulocytes: 0 %
Lymphocytes Absolute: 2.7 10*3/uL (ref 0.7–3.1)
Lymphs: 37 %
MCH: 21.9 pg — ABNORMAL LOW (ref 26.6–33.0)
MCHC: 30.3 g/dL — ABNORMAL LOW (ref 31.5–35.7)
MCV: 73 fL — ABNORMAL LOW (ref 79–97)
Monocytes Absolute: 0.7 10*3/uL (ref 0.1–0.9)
Monocytes: 10 %
Neutrophils Absolute: 3.7 10*3/uL (ref 1.4–7.0)
Neutrophils: 50 %
Platelets: 264 10*3/uL (ref 150–450)
RBC: 5.38 x10E6/uL — ABNORMAL HIGH (ref 3.77–5.28)
WBC: 7.3 10*3/uL (ref 3.4–10.8)

## 2018-12-05 NOTE — Patient Instructions (Signed)
Cynthia [name]  12/05/2018     @PREFPERIOPPHARMACY @   Your procedure is scheduled on  12/08/2018 .  Report to [facility] at  0800   A.M.  Call this number if you have problems the morning of surgery:  [phone]   Remember:  Do not eat or drink after midnight.                      Take these medicines the morning of surgery with A SIP OF WATER None    Do not wear jewelry, make-up or nail polish.  Do not wear lotions, powders, or perfumes. Please wear deodorant and brush your teeth.  Do not shave 48 hours prior to surgery.  Men may shave face and neck.  Do not bring valuables to the hospital.  [facility] is not responsible for any belongings or valuables.  Contacts, dentures or bridgework may not be worn into surgery.  Leave your suitcase in the car.  After surgery it may be brought to your room.  For patients admitted to the hospital, discharge time will be determined by your treatment team.  Patients discharged the day of surgery will not be allowed to drive home.   Name and phone number of your driver:   family Special instructions:  None  Please read over the following fact sheets that you were given. Pain Booklet, Coughing and Deep Breathing, Blood Transfusion Information, Surgical Site Infection Prevention, Anesthesia Post-op Instructions and Care and Recovery After Surgery       Supracervical Hysterectomy, Care After This sheet gives you information about how to care for yourself after your procedure. Your health care provider may also give you more specific instructions. If you have problems or questions, contact your health care provider. What can I expect after the procedure? After the procedure, it is common to have some discomfort, tenderness, swelling, and bruising at the surgical area. This normally lasts for about 2 weeks. Follow these instructions at home: Medicines  Take over-the-counter and prescription medicines only as told by your  health care provider.  Do not take aspirin. It can cause bleeding. Ask your health care provider when it is safe to use aspirin again.  Do not drive or use heavy machinery while taking prescription pain medicine.  To prevent or treat constipation while you are taking prescription pain medicine, your health care provider may recommend that you: ? Drink enough fluid to keep your urine pale yellow. ? Take over-the-counter or prescription medicines. ? Eat foods that are high in fiber, such as fresh fruits and vegetables, whole grains, and beans. ? Limit foods that are high in fat and processed sugars, such as fried and sweet foods. Activity  Get plenty of rest and sleep.  Try to have someone home with you for 1-2 weeks to help you with everyday chores.  Return to your normal activities as told by your health care provider. Ask your health care provider what activities are safe for you.  Do not lift anything that is heavier than 10 lb (4.5 kg) or the limit that your health care provider tells you until he or she says that it is safe.  Do not douche, use tampons, or have sex for at least 6 weeks or until your health care provider says it is safe to do so. Incision care   Follow instructions from your health care provider about how to take care of your incisions.  Make sure you: ? Wash your hands with soap and water before you change your bandage (dressing). If soap and water are not available, use hand sanitizer. ? Change your dressing as told by your health care provider. ? Leave stitches (sutures), skin glue, or adhesive strips in place. These skin closures may need to stay in place for 2 weeks or longer. If adhesive strip edges start to loosen and curl up, you may trim the loose edges. Do not remove adhesive strips completely unless your health care provider tells you to do that.  Check your incision area every day for signs of infection. Check for: ? Redness, swelling, or pain. ? Fluid  or blood. ? Warmth. ? Pus or a bad smell.  Take showers instead of baths for 2-3 weeks or as told by your health care provider. Eating and drinking  Drink enough fluids to keep your urine clear or pale yellow.  Do not drink alcohol until your health care provider says it is okay to do so. General instructions  Monitor your temperature for as long as told by your health care provider.  Keep all follow-up visits as told by your health care provider. This is important. Contact a health care provider if:  You have redness, swelling, or pain around an incision.  You have chills or fever.  You have fluid or blood coming from an incision.  An incision feels warm to the touch.  You have pus or a bad smell coming from an incision.  Your incisions break open.  You feel dizzy or lightheaded.  You have pain or bleeding when you urinate.  You have diarrhea that does not go away.  You have nausea and vomiting that do not go away.  You have abnormal vaginal discharge.  You have a rash.  You have pain that does not go away when you take medicine. Get help right away if:  You have a fever and your symptoms suddenly get worse.  You have severe abdominal pain.  You have chest pain.  You have shortness of breath.  You faint.  You have pain, swelling, or redness in your leg.  You have heavy vaginal bleeding with blood clots. Summary  After the procedure, it is common to have some discomfort, tenderness, swelling, and bruising at the surgical site. This normally lasts for about 2 weeks.  Get help right away if you have excessive vaginal bleeding or severe abdominal pain. This information is not intended to replace advice given to you by your health care provider. Make sure you discuss any questions you have with your health care provider. Document Released: 10/12/2012 Document Revised: 12/04/2016 Document Reviewed: 03/19/2016 Elsevier Patient Education  [place] Anesthesia, Adult, Care After This sheet gives you information about how to care for yourself after your procedure. Your health care provider may also give you more specific instructions. If you have problems or questions, contact your health care provider. What can I expect after the procedure? After the procedure, the following side effects are common:  Pain or discomfort at the IV site.  Nausea.  Vomiting.  Sore throat.  Trouble concentrating.  Feeling cold or chills.  Weak or tired.  Sleepiness and fatigue.  Soreness and body aches. These side effects can affect parts of the body that were not involved in surgery. Follow these instructions at home:  For at least 24 hours after the procedure:  Have a responsible adult stay with you. It is important to  have someone help care for you until you are awake and alert.  Rest as needed.  Do not: ? Participate in activities in which you could fall or become injured. ? Drive. ? Use heavy machinery. ? Drink alcohol. ? Take sleeping pills or medicines that cause drowsiness. ? Make important decisions or sign legal documents. ? Take care of children on your own. Eating and drinking  Follow any instructions from your health care provider about eating or drinking restrictions.  When you feel hungry, start by eating small amounts of foods that are soft and easy to digest (bland), such as toast. Gradually return to your regular diet.  Drink enough fluid to keep your urine pale yellow.  If you vomit, rehydrate by drinking water, juice, or clear broth. General instructions  If you have sleep apnea, surgery and certain medicines can increase your risk for breathing problems. Follow instructions from your health care provider about wearing your sleep device: ? Anytime you are sleeping, including during daytime naps. ? While taking prescription pain medicines, sleeping medicines, or medicines that make you  drowsy.  Return to your normal activities as told by your health care provider. Ask your health care provider what activities are safe for you.  Take over-the-counter and prescription medicines only as told by your health care provider.  If you smoke, do not smoke without supervision.  Keep all follow-up visits as told by your health care provider. This is important. Contact a health care provider if:  You have nausea or vomiting that does not get better with medicine.  You cannot eat or drink without vomiting.  You have pain that does not get better with medicine.  You are unable to pass urine.  You develop a skin rash.  You have a fever.  You have redness around your IV site that gets worse. Get help right away if:  You have difficulty breathing.  You have chest pain.  You have blood in your urine or stool, or you vomit blood. Summary  After the procedure, it is common to have a sore throat or nausea. It is also common to feel tired.  Have a responsible adult stay with you for the first 24 hours after general anesthesia. It is important to have someone help care for you until you are awake and alert.  When you feel hungry, start by eating small amounts of foods that are soft and easy to digest (bland), such as toast. Gradually return to your regular diet.  Drink enough fluid to keep your urine pale yellow.  Return to your normal activities as told by your health care provider. Ask your health care provider what activities are safe for you. This information is not intended to replace advice given to you by your health care provider. Make sure you discuss any questions you have with your health care provider. Document Released: 03/30/2000 Document Revised: 12/25/2016 Document Reviewed: 08/07/2016 Elsevier Patient Education  2020 [employer]. How to Use Chlorhexidine for Bathing Chlorhexidine gluconate (CHG) is a germ-killing (antiseptic) solution that is used to clean  the skin. It can get rid of the bacteria that normally live on the skin and can keep them away for about 24 hours. To clean your skin with CHG, you may be given:  A CHG solution to use in the shower or as part of a sponge bath.  A prepackaged cloth that contains CHG. Cleaning your skin with CHG may help lower the risk for infection:  While you are  staying in the intensive care unit of the hospital.  If you have a vascular access, such as a central line, to provide short-term or long-term access to your veins.  If you have a catheter to drain urine from your bladder.  If you are on a ventilator. A ventilator is a machine that helps you breathe by moving air in and out of your lungs.  After surgery. What are the risks? Risks of using CHG include:  A skin reaction.  Hearing loss, if CHG gets in your ears.  Eye injury, if CHG gets in your eyes and is not rinsed out.  The CHG product catching fire. Make sure that you avoid smoking and flames after applying CHG to your skin. Do not use CHG:  If you have a chlorhexidine allergy or have previously reacted to chlorhexidine.  On babies younger than 68 months of age. How to use CHG solution  Use CHG only as told by your health care provider, and follow the instructions on the label.  Use the full amount of CHG as directed. Usually, this is one bottle. During a shower Follow these steps when using CHG solution during a shower (unless your health care provider gives you different instructions): 1. Start the shower. 2. Use your normal soap and shampoo to wash your face and hair. 3. Turn off the shower or move out of the shower stream. 4. Pour the CHG onto a clean washcloth. Do not use any type of brush or rough-edged sponge. 5. Starting at your neck, lather your body down to your toes. Make sure you follow these instructions: ? If you will be having surgery, pay special attention to the part of your body where you will be having surgery.  Scrub this area for at least 1 minute. ? Do not use CHG on your head or face. If the solution gets into your ears or eyes, rinse them well with water. ? Avoid your genital area. ? Avoid any areas of skin that have broken skin, cuts, or scrapes. ? Scrub your back and under your arms. Make sure to wash skin folds. 6. Let the lather sit on your skin for 1-2 minutes or as long as told by your health care provider. 7. Thoroughly rinse your entire body in the shower. Make sure that all body creases and crevices are rinsed well. 8. Dry off with a clean towel. Do not put any substances on your body afterward--such as powder, lotion, or perfume--unless you are told to do so by your health care provider. Only use lotions that are recommended by the manufacturer. 9. Put on clean clothes or pajamas. 10. If it is the night before your surgery, sleep in clean sheets.  During a sponge bath Follow these steps when using CHG solution during a sponge bath (unless your health care provider gives you different instructions): 1. Use your normal soap and shampoo to wash your face and hair. 2. Pour the CHG onto a clean washcloth. 3. Starting at your neck, lather your body down to your toes. Make sure you follow these instructions: ? If you will be having surgery, pay special attention to the part of your body where you will be having surgery. Scrub this area for at least 1 minute. ? Do not use CHG on your head or face. If the solution gets into your ears or eyes, rinse them well with water. ? Avoid your genital area. ? Avoid any areas of skin that have broken skin, cuts, or  scrapes. ? Scrub your back and under your arms. Make sure to wash skin folds. 4. Let the lather sit on your skin for 1-2 minutes or as long as told by your health care provider. 5. Using a different clean, wet washcloth, thoroughly rinse your entire body. Make sure that all body creases and crevices are rinsed well. 6. Dry off with a clean towel.  Do not put any substances on your body afterward--such as powder, lotion, or perfume--unless you are told to do so by your health care provider. Only use lotions that are recommended by the manufacturer. 7. Put on clean clothes or pajamas. 8. If it is the night before your surgery, sleep in clean sheets. How to use CHG prepackaged cloths  Only use CHG cloths as told by your health care provider, and follow the instructions on the label.  Use the CHG cloth on clean, dry skin.  Do not use the CHG cloth on your head or face unless your health care provider tells you to.  When washing with the CHG cloth: ? Avoid your genital area. ? Avoid any areas of skin that have broken skin, cuts, or scrapes. Before surgery Follow these steps when using a CHG cloth to clean before surgery (unless your health care provider gives you different instructions): 1. Using the CHG cloth, vigorously scrub the part of your body where you will be having surgery. Scrub using a back-and-forth motion for 3 minutes. The area on your body should be completely wet with CHG when you are done scrubbing. 2. Do not rinse. Discard the cloth and let the area air-dry. Do not put any substances on the area afterward, such as powder, lotion, or perfume. 3. Put on clean clothes or pajamas. 4. If it is the night before your surgery, sleep in clean sheets.  For general bathing Follow these steps when using CHG cloths for general bathing (unless your health care provider gives you different instructions). 1. Use a separate CHG cloth for each area of your body. Make sure you wash between any folds of skin and between your fingers and toes. Wash your body in the following order, switching to a new cloth after each step: ? The front of your neck, shoulders, and chest. ? Both of your arms, under your arms, and your hands. ? Your stomach and groin area, avoiding the genitals. ? Your right leg and foot. ? Your left leg and foot. ? The back  of your neck, your back, and your buttocks. 2. Do not rinse. Discard the cloth and let the area air-dry. Do not put any substances on your body afterward--such as powder, lotion, or perfume--unless you are told to do so by your health care provider. Only use lotions that are recommended by the manufacturer. 3. Put on clean clothes or pajamas. Contact a health care provider if:  Your skin gets irritated after scrubbing.  You have questions about using your solution or cloth. Get help right away if:  Your eyes become very red or swollen.  Your eyes itch badly.  Your skin itches badly and is red or swollen.  Your hearing changes.  You have trouble seeing.  You have swelling or tingling in your mouth or throat.  You have trouble breathing.  You swallow any chlorhexidine. Summary  Chlorhexidine gluconate (CHG) is a germ-killing (antiseptic) solution that is used to clean the skin. Cleaning your skin with CHG may help to lower your risk for infection.  You may be given CHG  to use for bathing. It may be in a bottle or in a prepackaged cloth to use on your skin. Carefully follow your health care provider's instructions and the instructions on the product label.  Do not use CHG if you have a chlorhexidine allergy.  Contact your health care provider if your skin gets irritated after scrubbing. This information is not intended to replace advice given to you by your health care provider. Make sure you discuss any questions you have with your health care provider. Document Released: 09/16/2011 Document Revised: 03/10/2018 Document Reviewed: 11/19/2016 Elsevier Patient Education  2020 [employer].

## 2018-12-06 ENCOUNTER — Other Ambulatory Visit (HOSPITAL_COMMUNITY)
Admission: RE | Admit: 2018-12-06 | Discharge: 2018-12-06 | Disposition: A | Discharge status: Home / Self Care | Payer: 59 | Source: Ambulatory Visit | Attending: Obstetrics & Gynecology | Admitting: Obstetrics & Gynecology

## 2018-12-06 ENCOUNTER — Other Ambulatory Visit: Payer: Self-pay

## 2018-12-06 ENCOUNTER — Encounter (HOSPITAL_COMMUNITY): Payer: Self-pay

## 2018-12-06 ENCOUNTER — Encounter (HOSPITAL_COMMUNITY)
Admission: RE | Admit: 2018-12-06 | Discharge: 2018-12-06 | Disposition: A | Discharge status: Home / Self Care | Payer: 59 | Source: Ambulatory Visit | Attending: Obstetrics & Gynecology | Admitting: Obstetrics & Gynecology

## 2018-12-06 DIAGNOSIS — Z01812 Encounter for preprocedural laboratory examination: Secondary | ICD-10-CM | POA: Insufficient documentation

## 2018-12-06 HISTORY — DX: Personal history of diseases of the blood and blood-forming organs and certain disorders involving the immune mechanism: Z86.2

## 2018-12-06 LAB — CBC WITH DIFFERENTIAL/PLATELET
Abs Immature Granulocytes: 0.02 10*3/uL (ref 0.00–0.07)
Basophils Absolute: 0.1 10*3/uL (ref 0.0–0.1)
Basophils Relative: 1 %
Eosinophils Absolute: 0.1 10*3/uL (ref 0.0–0.5)
Eosinophils Relative: 1 %
HCT: 40.2 % (ref 36.0–46.0)
Hemoglobin: 12.1 g/dL (ref 12.0–15.0)
Immature Granulocytes: 0 %
Lymphocytes Relative: 33 %
Lymphs Abs: 2.5 10*3/uL (ref 0.7–4.0)
MCH: 22.7 pg — ABNORMAL LOW (ref 26.0–34.0)
MCHC: 30.1 g/dL (ref 30.0–36.0)
MCV: 75.6 fL — ABNORMAL LOW (ref 80.0–100.0)
Monocytes Absolute: 0.6 10*3/uL (ref 0.1–1.0)
Monocytes Relative: 8 %
Neutro Abs: 4.4 10*3/uL (ref 1.7–7.7)
Neutrophils Relative %: 57 %
Platelets: 220 10*3/uL (ref 150–400)
RBC: 5.32 MIL/uL — ABNORMAL HIGH (ref 3.87–5.11)
WBC: 7.7 10*3/uL (ref 4.0–10.5)
nRBC: 0 % (ref 0.0–0.2)

## 2018-12-06 LAB — HCG, QUANTITATIVE, PREGNANCY: hCG, Beta Chain, Quant, S: <1 m[IU]/mL (ref <5)

## 2018-12-06 LAB — SARS CORONAVIRUS 2 (TAT 6-24 HRS): SARS Coronavirus 2: NEGATIVE

## 2018-12-06 LAB — PREPARE RBC (CROSSMATCH)

## 2018-12-08 ENCOUNTER — Encounter (HOSPITAL_COMMUNITY): Payer: Self-pay | Admitting: *Deleted

## 2018-12-08 ENCOUNTER — Inpatient Hospital Stay (HOSPITAL_COMMUNITY): Payer: 59 | Admitting: Anesthesiology

## 2018-12-08 ENCOUNTER — Other Ambulatory Visit: Payer: Self-pay

## 2018-12-08 ENCOUNTER — Encounter (HOSPITAL_COMMUNITY)
Admission: RE | Disposition: A | Discharge status: Home / Self Care | Payer: Self-pay | Source: Home / Self Care | Attending: Obstetrics & Gynecology

## 2018-12-08 ENCOUNTER — Inpatient Hospital Stay (HOSPITAL_COMMUNITY)
Admission: RE | Admit: 2018-12-08 | Discharge: 2018-12-09 | DRG: 743 | Disposition: A | Discharge status: Home / Self Care | Payer: 59 | Attending: Obstetrics & Gynecology | Admitting: Obstetrics & Gynecology

## 2018-12-08 DIAGNOSIS — Z803 Family history of malignant neoplasm of breast: Secondary | ICD-10-CM

## 2018-12-08 DIAGNOSIS — D6489 Other specified anemias: Secondary | ICD-10-CM | POA: Diagnosis not present

## 2018-12-08 DIAGNOSIS — N921 Excessive and frequent menstruation with irregular cycle: Secondary | ICD-10-CM | POA: Diagnosis present

## 2018-12-08 DIAGNOSIS — D500 Iron deficiency anemia secondary to blood loss (chronic): Secondary | ICD-10-CM | POA: Diagnosis present

## 2018-12-08 DIAGNOSIS — Z90710 Acquired absence of both cervix and uterus: Secondary | ICD-10-CM | POA: Diagnosis present

## 2018-12-08 DIAGNOSIS — Z833 Family history of diabetes mellitus: Secondary | ICD-10-CM

## 2018-12-08 DIAGNOSIS — Z8249 Family history of ischemic heart disease and other diseases of the circulatory system: Secondary | ICD-10-CM

## 2018-12-08 DIAGNOSIS — D259 Leiomyoma of uterus, unspecified: Principal | ICD-10-CM | POA: Diagnosis present

## 2018-12-08 DIAGNOSIS — Z862 Personal history of diseases of the blood and blood-forming organs and certain disorders involving the immune mechanism: Secondary | ICD-10-CM

## 2018-12-08 DIAGNOSIS — D693 Immune thrombocytopenic purpura: Secondary | ICD-10-CM | POA: Diagnosis not present

## 2018-12-08 HISTORY — PX: SUPRACERVICAL ABDOMINAL HYSTERECTOMY: SHX5393

## 2018-12-08 SURGERY — HYSTERECTOMY, SUPRACERVICAL, ABDOMINAL
Anesthesia: General

## 2018-12-08 MED ORDER — PROMETHAZINE HCL 25 MG/ML IJ SOLN: 6.2500 mg | INTRAMUSCULAR | Status: DC | PRN

## 2018-12-08 MED ORDER — KCL IN DEXTROSE-NACL 20-5-0.45 MEQ/L-%-% IV SOLN
INTRAVENOUS | Status: DC
  Administered 2018-12-08 – 2018-12-09 (×3): via INTRAVENOUS

## 2018-12-08 MED ORDER — ARTIFICIAL TEARS OPHTHALMIC OINT
TOPICAL_OINTMENT | OPHTHALMIC | Status: AC
  Filled 2018-12-08: qty 7

## 2018-12-08 MED ORDER — ALUM & MAG HYDROXIDE-SIMETH 200-200-20 MG/5ML PO SUSP: 30.0000 mL | ORAL | Status: DC | PRN

## 2018-12-08 MED ORDER — SUGAMMADEX SODIUM 200 MG/2ML IV SOLN
INTRAVENOUS | Status: DC | PRN
  Administered 2018-12-08: 190 mg via INTRAVENOUS

## 2018-12-08 MED ORDER — GLYCOPYRROLATE PF 0.2 MG/ML IJ SOSY
PREFILLED_SYRINGE | INTRAMUSCULAR | Status: AC
  Filled 2018-12-08: qty 2

## 2018-12-08 MED ORDER — HYDROMORPHONE HCL 1 MG/ML IJ SOLN: 0.2500 mg | INTRAMUSCULAR | Status: DC | PRN

## 2018-12-08 MED ORDER — OXYCODONE HCL 5 MG PO TABS
5.0000 mg | ORAL_TABLET | ORAL | Status: DC | PRN
  Administered 2018-12-08 – 2018-12-09 (×2): 5 mg via ORAL
  Filled 2018-12-08: qty 1
  Filled 2018-12-08: qty 2

## 2018-12-08 MED ORDER — LIDOCAINE HCL (CARDIAC) PF 100 MG/5ML IV SOSY
PREFILLED_SYRINGE | INTRAVENOUS | Status: DC | PRN
  Administered 2018-12-08: 50 mg via INTRAVENOUS

## 2018-12-08 MED ORDER — MIDAZOLAM HCL 2 MG/2ML IJ SOLN: 0.5000 mg | Freq: Once | INTRAMUSCULAR | Status: DC | PRN

## 2018-12-08 MED ORDER — ROCURONIUM BROMIDE 100 MG/10ML IV SOLN
INTRAVENOUS | Status: DC | PRN
  Administered 2018-12-08: 5 mg via INTRAVENOUS
  Administered 2018-12-08: 40 mg via INTRAVENOUS
  Administered 2018-12-08: 10 mg via INTRAVENOUS

## 2018-12-08 MED ORDER — ONDANSETRON HCL 4 MG/2ML IJ SOLN
INTRAMUSCULAR | Status: AC
  Filled 2018-12-08: qty 2

## 2018-12-08 MED ORDER — BISACODYL 10 MG RE SUPP: 10.0000 mg | Freq: Every day | RECTAL | Status: DC | PRN

## 2018-12-08 MED ORDER — 0.9 % SODIUM CHLORIDE (POUR BTL) OPTIME
TOPICAL | Status: DC | PRN
  Administered 2018-12-08 (×2): 1000 mL

## 2018-12-08 MED ORDER — KETOROLAC TROMETHAMINE 30 MG/ML IJ SOLN
INTRAMUSCULAR | Status: AC
  Filled 2018-12-08: qty 1

## 2018-12-08 MED ORDER — DOCUSATE SODIUM 100 MG PO CAPS
100.0000 mg | ORAL_CAPSULE | Freq: Two times a day (BID) | ORAL | Status: DC
  Administered 2018-12-08 – 2018-12-09 (×2): 100 mg via ORAL
  Filled 2018-12-08 (×2): qty 1

## 2018-12-08 MED ORDER — DIPHENHYDRAMINE HCL 50 MG/ML IJ SOLN: 25.0000 mg | Freq: Every evening | INTRAMUSCULAR | Status: DC | PRN

## 2018-12-08 MED ORDER — HYDROCODONE-ACETAMINOPHEN 7.5-325 MG PO TABS: 1.0000 | ORAL_TABLET | Freq: Once | ORAL | Status: DC | PRN

## 2018-12-08 MED ORDER — DEXAMETHASONE SODIUM PHOSPHATE 10 MG/ML IJ SOLN
INTRAMUSCULAR | Status: AC
  Filled 2018-12-08: qty 1

## 2018-12-08 MED ORDER — PROPOFOL 10 MG/ML IV BOLUS
INTRAVENOUS | Status: DC | PRN
  Administered 2018-12-08: 150 mg via INTRAVENOUS

## 2018-12-08 MED ORDER — FENTANYL CITRATE (PF) 100 MCG/2ML IJ SOLN
INTRAMUSCULAR | Status: AC
  Filled 2018-12-08: qty 4

## 2018-12-08 MED ORDER — IBUPROFEN 800 MG PO TABS: 800.0000 mg | ORAL_TABLET | Freq: Four times a day (QID) | ORAL | Status: DC

## 2018-12-08 MED ORDER — ONDANSETRON HCL 4 MG/2ML IJ SOLN
INTRAMUSCULAR | Status: DC | PRN
  Administered 2018-12-08: 4 mg via INTRAVENOUS

## 2018-12-08 MED ORDER — KETOROLAC TROMETHAMINE 30 MG/ML IJ SOLN
30.0000 mg | Freq: Four times a day (QID) | INTRAMUSCULAR | Status: DC
  Administered 2018-12-08 – 2018-12-09 (×3): 30 mg via INTRAVENOUS
  Filled 2018-12-08 (×3): qty 1

## 2018-12-08 MED ORDER — FENTANYL CITRATE (PF) 100 MCG/2ML IJ SOLN
INTRAMUSCULAR | Status: AC
  Filled 2018-12-08: qty 2

## 2018-12-08 MED ORDER — LACTATED RINGERS IV SOLN
INTRAVENOUS | Status: DC
  Administered 2018-12-08: 1000 mL via INTRAVENOUS
  Administered 2018-12-08: 11:00:00 via INTRAVENOUS

## 2018-12-08 MED ORDER — HYDROMORPHONE HCL 1 MG/ML IJ SOLN
0.2500 mg | INTRAMUSCULAR | Status: DC | PRN
  Administered 2018-12-08 (×2): 0.5 mg via INTRAVENOUS
  Filled 2018-12-08: qty 0.5

## 2018-12-08 MED ORDER — HYDROMORPHONE HCL 1 MG/ML IJ SOLN
INTRAMUSCULAR | Status: AC
  Filled 2018-12-08: qty 0.5

## 2018-12-08 MED ORDER — GLYCOPYRROLATE 0.2 MG/ML IJ SOLN
INTRAMUSCULAR | Status: DC | PRN
  Administered 2018-12-08: 0.1 mg via INTRAVENOUS

## 2018-12-08 MED ORDER — BUPIVACAINE LIPOSOME 1.3 % IJ SUSP
INTRAMUSCULAR | Status: AC
  Filled 2018-12-08: qty 20

## 2018-12-08 MED ORDER — CEFAZOLIN SODIUM-DEXTROSE 2-4 GM/100ML-% IV SOLN
2.0000 g | Freq: Once | INTRAVENOUS | Status: AC
  Administered 2018-12-08: 10:00:00 2 g via INTRAVENOUS
  Filled 2018-12-08: qty 100

## 2018-12-08 MED ORDER — KETOROLAC TROMETHAMINE 30 MG/ML IJ SOLN
INTRAMUSCULAR | Status: DC | PRN
  Administered 2018-12-08: 30 mg via INTRAVENOUS

## 2018-12-08 MED ORDER — PROPOFOL 10 MG/ML IV BOLUS
INTRAVENOUS | Status: AC
  Filled 2018-12-08: qty 40

## 2018-12-08 MED ORDER — ROCURONIUM BROMIDE 10 MG/ML (PF) SYRINGE
PREFILLED_SYRINGE | INTRAVENOUS | Status: AC
  Filled 2018-12-08: qty 10

## 2018-12-08 MED ORDER — FENTANYL CITRATE (PF) 100 MCG/2ML IJ SOLN
INTRAMUSCULAR | Status: DC | PRN
  Administered 2018-12-08 (×2): 25 ug via INTRAVENOUS
  Administered 2018-12-08 (×2): 50 ug via INTRAVENOUS
  Administered 2018-12-08 (×3): 25 ug via INTRAVENOUS
  Administered 2018-12-08: 50 ug via INTRAVENOUS
  Administered 2018-12-08: 25 ug via INTRAVENOUS

## 2018-12-08 MED ORDER — DEXAMETHASONE SODIUM PHOSPHATE 4 MG/ML IJ SOLN
INTRAMUSCULAR | Status: DC | PRN
  Administered 2018-12-08: 8 mg via INTRAVENOUS

## 2018-12-08 MED ORDER — BUPIVACAINE LIPOSOME 1.3 % IJ SUSP
INTRAMUSCULAR | Status: DC | PRN
  Administered 2018-12-08: 20 mL

## 2018-12-08 MED ORDER — ONDANSETRON HCL 4 MG PO TABS: 8.0000 mg | ORAL_TABLET | Freq: Four times a day (QID) | ORAL | Status: DC | PRN

## 2018-12-08 MED ORDER — FENTANYL CITRATE (PF) 100 MCG/2ML IJ SOLN
50.0000 ug | INTRAMUSCULAR | Status: DC | PRN
  Administered 2018-12-08: 50 ug via INTRAVENOUS
  Filled 2018-12-08: qty 2

## 2018-12-08 MED ORDER — HEMOSTATIC AGENTS (NO CHARGE) OPTIME
TOPICAL | Status: DC | PRN
  Administered 2018-12-08: 1 via TOPICAL

## 2018-12-08 MED ORDER — SODIUM CHLORIDE 0.9 % IV SOLN
8.0000 mg | Freq: Four times a day (QID) | INTRAVENOUS | Status: DC | PRN
  Filled 2018-12-08: qty 4

## 2018-12-08 MED ORDER — MIDAZOLAM HCL 2 MG/2ML IJ SOLN
INTRAMUSCULAR | Status: AC
  Filled 2018-12-08: qty 2

## 2018-12-08 MED ORDER — ENOXAPARIN SODIUM 40 MG/0.4ML ~~LOC~~ SOLN
40.0000 mg | SUBCUTANEOUS | Status: DC
  Administered 2018-12-09: 40 mg via SUBCUTANEOUS
  Filled 2018-12-08: qty 0.4

## 2018-12-08 MED ORDER — LIDOCAINE 2% (20 MG/ML) 5 ML SYRINGE
INTRAMUSCULAR | Status: AC
  Filled 2018-12-08: qty 5

## 2018-12-08 MED ORDER — SENNOSIDES-DOCUSATE SODIUM 8.6-50 MG PO TABS: 1.0000 | ORAL_TABLET | Freq: Every evening | ORAL | Status: DC | PRN

## 2018-12-08 MED ORDER — PROMETHAZINE HCL 25 MG/ML IJ SOLN: 25.0000 mg | Freq: Four times a day (QID) | INTRAMUSCULAR | Status: DC | PRN

## 2018-12-08 MED ORDER — GABAPENTIN 100 MG PO CAPS
100.0000 mg | ORAL_CAPSULE | Freq: Three times a day (TID) | ORAL | Status: DC
  Administered 2018-12-08 – 2018-12-09 (×3): 100 mg via ORAL
  Filled 2018-12-08 (×3): qty 1

## 2018-12-08 MED ORDER — MIDAZOLAM HCL 2 MG/2ML IJ SOLN
INTRAMUSCULAR | Status: DC | PRN
  Administered 2018-12-08: 1 mg via INTRAVENOUS

## 2018-12-08 MED ORDER — ZOLPIDEM TARTRATE 5 MG PO TABS: 5.0000 mg | ORAL_TABLET | Freq: Every evening | ORAL | Status: DC | PRN

## 2018-12-08 SURGICAL SUPPLY — 46 items
ADH SKN CLS APL DERMABOND .7 (GAUZE/BANDAGES/DRESSINGS) ×1
BLADE SURG SZ10 CARB STEEL (BLADE) ×3 IMPLANT
CATH FOLEY LATEX FREE 14FR (CATHETERS) ×3
CATH FOLEY LF 14FR (CATHETERS) IMPLANT
CLOTH BEACON ORANGE TIMEOUT ST (SAFETY) ×3 IMPLANT
COVER LIGHT HANDLE STERIS (MISCELLANEOUS) ×6 IMPLANT
COVER WAND RF STERILE (DRAPES) ×3 IMPLANT
DERMABOND ADVANCED (GAUZE/BANDAGES/DRESSINGS) ×2
DERMABOND ADVANCED .7 DNX12 (GAUZE/BANDAGES/DRESSINGS) ×1 IMPLANT
DRAPE WARM FLUID 44X44 (DRAPES) ×3 IMPLANT
DRSG OPSITE POSTOP 4X8 (GAUZE/BANDAGES/DRESSINGS) ×3 IMPLANT
ELECT REM PT RETURN 9FT ADLT (ELECTROSURGICAL) ×3
ELECTRODE REM PT RTRN 9FT ADLT (ELECTROSURGICAL) ×1 IMPLANT
GAUZE 4X4 16PLY RFD (DISPOSABLE) ×3 IMPLANT
GLOVE BIOGEL PI IND STRL 7.0 (GLOVE) ×4 IMPLANT
GLOVE BIOGEL PI IND STRL 8 (GLOVE) ×1 IMPLANT
GLOVE BIOGEL PI INDICATOR 7.0 (GLOVE) ×12
GLOVE BIOGEL PI INDICATOR 8 (GLOVE) ×2
GLOVE ECLIPSE 6.5 STRL STRAW (GLOVE) ×4 IMPLANT
GLOVE ECLIPSE 8.0 STRL XLNG CF (GLOVE) ×3 IMPLANT
GOWN STRL REUS W/TWL LRG LVL3 (GOWN DISPOSABLE) ×6 IMPLANT
GOWN STRL REUS W/TWL XL LVL3 (GOWN DISPOSABLE) ×3 IMPLANT
HEMOSTAT ARISTA ABSORB 3G PWDR (HEMOSTASIS) ×2 IMPLANT
INST SET MAJOR GENERAL (KITS) ×3 IMPLANT
KIT TURNOVER KIT A (KITS) ×3 IMPLANT
MANIFOLD NEPTUNE II (INSTRUMENTS) ×3 IMPLANT
NDL HYPO 18GX1.5 BLUNT FILL (NEEDLE) IMPLANT
NDL HYPO 21X1.5 SAFETY (NEEDLE) ×1 IMPLANT
NEEDLE HYPO 18GX1.5 BLUNT FILL (NEEDLE) ×3 IMPLANT
NEEDLE HYPO 21X1.5 SAFETY (NEEDLE) ×3 IMPLANT
NS IRRIG 1000ML POUR BTL (IV SOLUTION) ×6 IMPLANT
PACK ABDOMINAL MAJOR (CUSTOM PROCEDURE TRAY) ×3 IMPLANT
PAD ARMBOARD 7.5X6 YLW CONV (MISCELLANEOUS) ×3 IMPLANT
SET BASIN LINEN APH (SET/KITS/TRAYS/PACK) ×3 IMPLANT
SPONGE LAP 18X18 RF (DISPOSABLE) ×8 IMPLANT
SUT CHROMIC 0 CT 1 (SUTURE) ×3 IMPLANT
SUT MON AB 3-0 SH 27 (SUTURE) ×4 IMPLANT
SUT VIC AB 0 CT1 27 (SUTURE) ×9
SUT VIC AB 0 CT1 27XCR 8 STRN (SUTURE) ×3 IMPLANT
SUT VIC AB 0 CTX 36 (SUTURE) ×3
SUT VIC AB 0 CTX36XBRD ANTBCTR (SUTURE) ×1 IMPLANT
SUT VICRYL 3 0 (SUTURE) ×5 IMPLANT
SYR 20ML LL LF (SYRINGE) ×3 IMPLANT
SYR 30ML LL (SYRINGE) ×2 IMPLANT
TOWEL SURG RFD BLUE STRL DISP (DISPOSABLE) ×3 IMPLANT
TRAY FOLEY MTR SLVR 16FR STAT (SET/KITS/TRAYS/PACK) ×3 IMPLANT

## 2018-12-08 NOTE — Anesthesia Preprocedure Evaluation (Signed)
Anesthesia Evaluation  Patient identified by MRN, date of birth, ID band Patient awake    Reviewed: Allergy & Precautions, NPO status , Patient's Chart, lab work & pertinent test results  Airway Mallampati: I  TM Distance: >3 FB Neck ROM: Full    Dental no notable dental hx. (+) Teeth Intact   Pulmonary neg pulmonary ROS,    Pulmonary exam normal breath sounds clear to auscultation       Cardiovascular Exercise Tolerance: Good negative cardio ROS Normal cardiovascular examI Rhythm:Regular Rate:Normal     Neuro/Psych negative neurological ROS  negative psych ROS   GI/Hepatic negative GI ROS, Neg liver ROS,   Endo/Other  negative endocrine ROS  Renal/GU negative Renal ROS  negative genitourinary   Musculoskeletal negative musculoskeletal ROS (+)   Abdominal   Peds negative pediatric ROS (+)  Hematology negative hematology ROS (+) anemia , Had anemia and ITP  Given pRBCs and Dex  Latest 12/40.2 plt 226K    Anesthesia Other Findings   Reproductive/Obstetrics negative OB ROS                             Anesthesia Physical Anesthesia Plan  ASA: II  Anesthesia Plan: General   Post-op Pain Management:    Induction: Intravenous  PONV Risk Score and Plan: 3 and Midazolam, Ondansetron, Dexamethasone and Treatment may vary due to age or medical condition  Airway Management Planned: Oral ETT  Additional Equipment:   Intra-op Plan:   Post-operative Plan: Extubation in OR  Informed Consent: I have reviewed the patients History and Physical, chart, labs and discussed the procedure including the risks, benefits and alternatives for the proposed anesthesia with the patient or authorized representative who has indicated his/her understanding and acceptance.     Dental advisory given  Plan Discussed with: CRNA  Anesthesia Plan Comments: (Plan Full PPE use Plan GETA D/W PT -WTP with  same after Q&A)        Anesthesia Quick Evaluation

## 2018-12-08 NOTE — Op Note (Signed)
Preoperative diagnosis:  1.  Enlarged fibroid uterus                                          2.  Menometrorrhagia, managed with megestrol                                         3.  Anemia, s/p blood transfusion                                         4.  ITP, normalized with steroids  Postoperative diagnosis:  Same as above   Procedure:  Abdominal hysterectomy, supracervical  Surgeon:  Florian Buff  Assistant:    Anesthesia:  General endotracheal  Preoperative clinical summary:    Cynthia [name] is a 38 y.o. [id number] with No LMP recorded. admitted for a abdominal supracervical hysterectomy.  Pt has 14 weeks size fibroids and menometrorrhagia resulting in significant anemia Was admitted in November for hysterectomy but also found to have ITP Responded to oral steroid therapy Also received 2 units PRBC at that time as well  Intraoperative findings: multiple fibroids, one on left was broad ligament  Description of operation:  Patient was taken to the operating room and placed in the supine position where she underwent general endotracheal anesthesia.  She was then prepped and draped in the usual sterile fashion and a Foley catheter was placed for continuous bladder drainage.  A Pfannenstiel skin incision was made and carried down sharply to the rectus fascia which was scored in the midline and extended laterally.  The fascia was taken off the muscles superiorly and inferiorly without difficulty.  The muscles were divided.  The peritoneal cavity was entered.  The enlarged uterus was exteriorizeed.   Both uterine cornu were grasped with Coker clamps. A thyroid tenaculum was placed on the fundus for central manipulation. The left round ligament was suture ligated and coagulated with the electrocautery unit.  The left vesicouterine serosal flap was created.  An avascular window in in the peritoneum was created and the utero-ovarian ligament was cross clamped, cut and suture ligated.  The right  round ligament was suture ligated and cut with the electrocautery unit.  The vesicouterine serosal flap on the right was created.  An avascular window in the peritoneum was created and the right utero-ovarian ligament was cross clamped, cut and double suture ligated.  Thus both ovaries were preserved.  The uterine vessels were skeletonized bilaterally.  The uterine vessels were clamped bilaterally,  then cut and suture ligated.  Two more pedicles were taken down the cervix medial to the uterine vessels.  Each pedicle was clamped cut and suture ligated with good resulting hemostasis.  As per the preoperative plan the cervix was then transected sharply and the specimen was removed.  The cervical stump was then closed anterior to posterior for hemostasis and reduce postoperative adhesions.  The pelvis was irrigated vigorously and all pedicles were examined and found to be hemostatic.  Both Fallpoian tubes were removed by cross clamping with a Kelley clamp and a fore and aft 3-0 monocryl suture for hemostasis.  All specimens were sent to pathology for routine evaluation.   the pelvis  was irrigated vigorously.  All packs were removed and all counts were correct at this point x 3.  The muscles and peritoneum were reapproximated loosely.  The fascia was closed with 0 Vicryl running.    Exparel was injected into the subcutaneous tissue. The skin was closed using 3-0 Vicryl on a Keith needle in a subcuticular fashion.  Dermabond was then applied for additional wound integrity and to serve as a postoperative bacterial barrier.  The patient was awakened from anesthesia taken to the recovery room in good stable condition. All sponge instrument and needle counts were correct x 3.  The patient received Ancef and Toradol prophylactically preoperatively.  Estimated blood loss for the procedure was 150  cc.  Florian Buff, MD  12/08/2018 11:25 AM

## 2018-12-08 NOTE — Transfer of Care (Signed)
Immediate Anesthesia Transfer of Care Note  Patient: Cynthia [name]  Procedure(s) Performed: HYSTERECTOMY SUPRACERVICAL ABDOMINAL (N/A )  Patient Location: PACU  Anesthesia Type:General  Level of Consciousness: awake, alert , oriented and patient cooperative  Airway & Oxygen Therapy: Patient Spontanous Breathing and Patient connected to face mask oxygen  Post-op Assessment: Report given to RN and Post -op Vital signs reviewed and stable  Post vital signs: Reviewed and stable  Last Vitals:  Vitals Value Taken Time  BP 113/68 12/08/18 1136  Temp 36.6 C 12/08/18 1136  Pulse 69 12/08/18 1136  Resp    SpO2      Last Pain:  Vitals:   12/08/18 0813  TempSrc: Oral  PainSc: 0-No pain      Patients Stated Pain Goal: 4 ([id number])  Complications: No apparent anesthesia complications

## 2018-12-08 NOTE — Plan of Care (Signed)
  Problem: Education: Goal: Knowledge of General Education information will improve Description Including pain rating scale, medication(s)/side effects and non-pharmacologic comfort measures Outcome: Progressing   Problem: Health Behavior/Discharge Planning: Goal: Ability to manage health-related needs will improve Outcome: Progressing   

## 2018-12-08 NOTE — H&P (Signed)
Preoperative History and Physical  Cynthia [name] is a 38 y.o. [id number] with No LMP recorded. admitted for a abdominal supracervical hysterectomy.  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 History of ITP     PSH:     Past Surgical History:  Procedure Laterality Date  . CHOLECYSTECTOMY    . GALLBLADDER SURGERY    . TUBAL LIGATION      POb/GynH:      OB History    Gravida  6   Para  5   Term  4   Preterm  1   AB  1   Living  3     SAB  1   TAB      Ectopic      Multiple      Live Births  3           SH:   Social History   Tobacco Use  . Smoking status: Never Smoker  . Smokeless tobacco: Never Used  Substance Use Topics  . Alcohol use: Yes    Comment: "every now and then"   . Drug use: Not Currently    Types: Marijuana    Comment: Last used July 2020.    FH:    Family History  Problem Relation Age of Onset  . Aneurysm Maternal Grandmother   . Diabetes Father   . Breast cancer Mother   . Hypertension Sister      Allergies: No Known Allergies  Medications:       Current Facility-Administered Medications:  .  ceFAZolin (ANCEF) IVPB 2g/100 mL premix, 2 g, Intravenous, Once, Florian Buff, MD .  lactated ringers infusion, , Intravenous, Continuous, [place], Talbert Forest, MD, Last Rate: 50 mL/hr at 12/08/18 0827, 1,000 mL at 12/08/18 0827  Review of Systems:   Review of Systems  Constitutional: Negative for fever, chills, weight loss, malaise/fatigue and diaphoresis.  HENT: Negative for hearing loss, ear pain, nosebleeds, congestion, sore throat, neck pain, tinnitus and ear discharge.   Eyes: Negative for blurred vision, double vision, photophobia, pain, discharge and redness.  Respiratory: Negative  for cough, hemoptysis, sputum production, shortness of breath, wheezing and stridor.   Cardiovascular: Negative for chest pain, palpitations, orthopnea, claudication, leg swelling and PND.  Gastrointestinal: Positive for abdominal pain. Negative for heartburn, nausea, vomiting, diarrhea, constipation, blood in stool and melena.  Genitourinary: Negative for dysuria, urgency, frequency, hematuria and flank pain.  Musculoskeletal: Negative for myalgias, back pain, joint pain and falls.  Skin: Negative for itching and rash.  Neurological: Negative for dizziness, tingling, tremors, sensory change, speech change, focal weakness, seizures, loss of consciousness, weakness and headaches.  Endo/Heme/Allergies: Negative for environmental allergies and polydipsia. Does not bruise/bleed easily.  Psychiatric/Behavioral: Negative for depression, suicidal ideas, hallucinations, memory loss and substance abuse. The patient is not nervous/anxious and does not have insomnia.      PHYSICAL EXAM:  Blood pressure 126/85, pulse 85, temperature 98.3 F (36.8 C), temperature source Oral, resp. rate 20, SpO2 100 %.    Vitals reviewed. Constitutional: She is oriented to person, place, and time. She appears well-developed and well-nourished.  HENT:  Head: Normocephalic and atraumatic.  Right Ear: External ear normal.  Left Ear: External ear normal.  Nose:  Nose normal.  Mouth/Throat: Oropharynx is clear and moist.  Eyes: Conjunctivae and EOM are normal. Pupils are equal, round, and reactive to light. Right eye exhibits no discharge. Left eye exhibits no discharge. No scleral icterus.  Neck: Normal range of motion. Neck supple. No tracheal deviation present. No thyromegaly present.  Cardiovascular: Normal rate, regular rhythm, normal heart sounds and intact distal pulses.  Exam reveals no gallop and no friction rub.   No murmur heard. Respiratory: Effort normal and breath sounds normal. No respiratory distress. She  has no wheezes. She has no rales. She exhibits no tenderness.  GI: Soft. Bowel sounds are normal. She exhibits no distension and no mass. There is tenderness. There is no rebound and no guarding.  Genitourinary:       Vulva is normal without lesions Vagina is pink moist without discharge Cervix normal in appearance and pap is normal Uterus is 14 weeks size fibroids Adnexa is negative with normal sized ovaries by sonogram  Musculoskeletal: Normal range of motion. She exhibits no edema and no tenderness.  Neurological: She is alert and oriented to person, place, and time. She has normal reflexes. She displays normal reflexes. No cranial nerve deficit. She exhibits normal muscle tone. Coordination normal.  Skin: Skin is warm and dry. No rash noted. No erythema. No pallor.  Psychiatric: She has a normal mood and affect. Her behavior is normal. Judgment and thought content normal.    Labs: Results for orders placed or performed during the hospital encounter of 12/06/18 (from the past 336 hour(s))  CBC with Differential/Platelet   Collection Time: 12/06/18  3:39 PM  Result Value Ref Range   WBC 7.7 4.0 - 10.5 K/uL   RBC 5.32 (H) 3.87 - 5.11 MIL/uL   Hemoglobin 12.1 12.0 - 15.0 g/dL   HCT 40.2 36.0 - 46.0 %   MCV 75.6 (L) 80.0 - 100.0 fL   MCH 22.7 (L) 26.0 - 34.0 pg   MCHC 30.1 30.0 - 36.0 g/dL   RDW Not Measured 11.5 - 15.5 %   Platelets 220 150 - 400 K/uL   nRBC 0.0 0.0 - 0.2 %   Neutrophils Relative % 57 %   Neutro Abs 4.4 1.7 - 7.7 K/uL   Lymphocytes Relative 33 %   Lymphs Abs 2.5 0.7 - 4.0 K/uL   Monocytes Relative 8 %   Monocytes Absolute 0.6 0.1 - 1.0 K/uL   Eosinophils Relative 1 %   Eosinophils Absolute 0.1 0.0 - 0.5 K/uL   Basophils Relative 1 %   Basophils Absolute 0.1 0.0 - 0.1 K/uL   Immature Granulocytes 0 %   Abs Immature Granulocytes 0.02 0.00 - 0.07 K/uL   Schistocytes PRESENT    Dimorphism PRESENT    Ovalocytes PRESENT   hCG, quantitative, pregnancy    Collection Time: 12/06/18  3:39 PM  Result Value Ref Range   hCG, Beta Chain, Quant, S <1 <5 mIU/mL  Prepare RBC   Collection Time: 12/06/18  3:39 PM  Result Value Ref Range   Order Confirmation      ORDER PROCESSED BY BLOOD BANK Performed at [facility], [address], [place], [place] [address]   Type and screen [facility]   Collection Time: 12/06/18  3:39 PM  Result Value Ref Range   ABO/RH(D) B POS    Antibody Screen POS    Sample Expiration 12/09/2018,2359    Extend sample reason NO TRANSFUSIONS OR PREGNANCY IN THE PAST 3 MONTHS    Antibody Identification  Salomon Mast    Unit Number [id number]    Blood Component Type RED CELLS,LR    Unit division 00    Status of Unit ALLOCATED    Transfusion Status OK TO TRANSFUSE    Crossmatch Result      COMPATIBLE Performed at [facility], [address], [place], [place] [address]    Unit Number [id number]    Blood Component Type RED CELLS,LR    Unit division 00    Status of Unit ALLOCATED    Transfusion Status OK TO TRANSFUSE    Crossmatch Result COMPATIBLE   BPAM RBC   Collection Time: 12/06/18  3:39 PM  Result Value Ref Range   Blood Product Unit Number [id number]    PRODUCT CODE [id number]    Unit Type and Rh 7300    Blood Product Expiration Date [id number]    Blood Product Unit Number [id number]    PRODUCT CODE [id number]    Unit Type and Rh 7300    Blood Product Expiration Date [id number]   Results for orders placed or performed during the hospital encounter of 12/06/18 (from the past 336 hour(s))  SARS CORONAVIRUS 2 (TAT 6-24 HRS) Nasopharyngeal Nasopharyngeal Swab   Collection Time: 12/06/18  7:21 AM   Specimen: Nasopharyngeal Swab  Result Value Ref Range   SARS Coronavirus 2 NEGATIVE NEGATIVE  Results for orders placed or performed in visit on 11/30/18 (from the past 336 hour(s))  CBC w/Diff   Collection Time: 11/30/18 10:01 AM  Result Value Ref Range   WBC 7.3 3.4 - 10.8 x10E3/uL   RBC 5.38 (H)  3.77 - 5.28 x10E6/uL   Hemoglobin 11.8 11.1 - 15.9 g/dL   Hematocrit 39.0 34.0 - 46.6 %   MCV 73 (L) 79 - 97 fL   MCH 21.9 (L) 26.6 - 33.0 pg   MCHC 30.3 (L) 31.5 - 35.7 g/dL   Platelets 264 150 - 450 x10E3/uL   Neutrophils 50 Not Estab. %   Lymphs 37 Not Estab. %   Monocytes 10 Not Estab. %   Eos 2 Not Estab. %   Basos 1 Not Estab. %   Neutrophils Absolute 3.7 1.4 - 7.0 x10E3/uL   Lymphocytes Absolute 2.7 0.7 - 3.1 x10E3/uL   Monocytes Absolute 0.7 0.1 - 0.9 x10E3/uL   EOS (ABSOLUTE) 0.1 0.0 - 0.4 x10E3/uL   Basophils Absolute 0.1 0.0 - 0.2 x10E3/uL   Immature Granulocytes 0 Not Estab. %   Immature Grans (Abs) 0.0 0.0 - 0.1 x10E3/uL   Hematology Comments: Note:     EKG: No orders found for this or any previous visit.  Imaging Studies: CLINICAL DATA:  Irregular menstrual cycle  EXAM: TRANSABDOMINAL AND TRANSVAGINAL ULTRASOUND OF PELVIS  TECHNIQUE: Both transabdominal and transvaginal ultrasound examinations of the pelvis were performed. Transabdominal technique was performed for global imaging of the pelvis including uterus, ovaries, adnexal regions, and pelvic cul-de-sac. It was necessary to proceed with endovaginal exam following the transabdominal exam to visualize the uterus and adnexa.  COMPARISON:  CT abdomen and pelvis 07/23/2006  FINDINGS: Uterus  Measurements: 10.9 x 5.9 x 8.0 cm = volume: 268 mL. Anteverted. Large LEFT lateral exophytic leiomyoma 4.7 x 4.8 x 5.0 cm. No additional uterine masses. Mildly inhomogeneous myometrium otherwise seen.  Endometrium  Thickness: 11 mm.  No endometrial fluid or focal abnormality  Right ovary  Measurements: 3.7 x 1.4 x 4.4 cm = volume: 11.6 mL. Seen only on transabdominal imaging. Normal morphology without mass  Left ovary  Measurements: 3.2  x 1.7 x 2.7 cm = volume: 7.8 mL. Seen only on transabdominal imaging. Normal morphology without mass  Other findings  No free pelvic fluid or adnexal  masses.  IMPRESSION: Large LEFT lateral exophytic leiomyoma 5.0 cm greatest size.  Otherwise unremarkable exam.   Electronically Signed   By: Lavonia Dana M.D.   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  The patient also understands the alternative treatment options which were discussed in full.  All questions were answered.  Mertie Clause Eure 12/08/2018 9:24 AM   Florian Buff 12/08/2018 9:22 AM

## 2018-12-08 NOTE — Anesthesia Postprocedure Evaluation (Signed)
Anesthesia Post Note  Patient: Cynthia [name]  Procedure(s) Performed: HYSTERECTOMY SUPRACERVICAL ABDOMINAL (N/A )  Patient location during evaluation: PACU Anesthesia Type: General Level of consciousness: awake and alert and oriented Pain management: pain level controlled Vital Signs Assessment: post-procedure vital signs reviewed and stable Respiratory status: spontaneous breathing Cardiovascular status: blood pressure returned to baseline and stable Postop Assessment: no apparent nausea or vomiting Anesthetic complications: no     Last Vitals:  Vitals:   12/08/18 1200 12/08/18 1230  BP:  124/72  Pulse:  60  Resp:  18  Temp:  36.6 C  SpO2: 100%     Last Pain:  Vitals:   12/08/18 1230  TempSrc:   PainSc: 3                  Terrian Sentell

## 2018-12-08 NOTE — Anesthesia Procedure Notes (Signed)
Procedure Name: Intubation Date/Time: 12/08/2018 9:44 AM Performed by: Georgeanne Nim, CRNA Pre-anesthesia Checklist: Patient identified, Emergency Drugs available, Suction available, Patient being monitored and Timeout performed Patient Re-evaluated:Patient Re-evaluated prior to induction Oxygen Delivery Method: Circle system utilized Preoxygenation: Pre-oxygenation with 100% oxygen Induction Type: IV induction Ventilation: Mask ventilation without difficulty Laryngoscope Size: Mac and 4 Grade View: Grade I Tube type: Oral Tube size: 7.0 mm Number of attempts: 1 Airway Equipment and Method: Stylet Placement Confirmation: ETT inserted through vocal cords under direct vision,  positive ETCO2,  CO2 detector and breath sounds checked- equal and bilateral Secured at: 20 cm Tube secured with: Tape Dental Injury: Teeth and Oropharynx as per pre-operative assessment

## 2018-12-09 ENCOUNTER — Encounter (HOSPITAL_COMMUNITY): Payer: Self-pay | Admitting: Obstetrics & Gynecology

## 2018-12-09 LAB — BASIC METABOLIC PANEL
Anion gap: 7 (ref 5–15)
BUN: 6 mg/dL (ref 6–20)
CO2: 19 mmol/L — ABNORMAL LOW (ref 22–32)
Calcium: 8.6 mg/dL — ABNORMAL LOW (ref 8.9–10.3)
Chloride: 113 mmol/L — ABNORMAL HIGH (ref 98–111)
Creatinine, Ser: 0.81 mg/dL (ref 0.44–1.00)
GFR calc Af Amer: >60 mL/min (ref >60)
GFR calc non Af Amer: >60 mL/min (ref >60)
Glucose, Bld: 115 mg/dL — ABNORMAL HIGH (ref 70–99)
Potassium: 3.6 mmol/L (ref 3.5–5.1)
Sodium: 139 mmol/L (ref 135–145)

## 2018-12-09 LAB — CBC
HCT: 37.2 % (ref 36.0–46.0)
Hemoglobin: 11.1 g/dL — ABNORMAL LOW (ref 12.0–15.0)
MCH: 22.8 pg — ABNORMAL LOW (ref 26.0–34.0)
MCHC: 29.8 g/dL — ABNORMAL LOW (ref 30.0–36.0)
MCV: 76.5 fL — ABNORMAL LOW (ref 80.0–100.0)
Platelets: 174 10*3/uL (ref 150–400)
RBC: 4.86 MIL/uL (ref 3.87–5.11)
WBC: 9.3 10*3/uL (ref 4.0–10.5)
nRBC: 0 % (ref 0.0–0.2)

## 2018-12-09 MED ORDER — ONDANSETRON HCL 8 MG PO TABS: 8.0000 mg | ORAL_TABLET | Freq: Four times a day (QID) | ORAL | 0 refills | Status: DC | PRN

## 2018-12-09 MED ORDER — KETOROLAC TROMETHAMINE 10 MG PO TABS: 10.0000 mg | ORAL_TABLET | Freq: Three times a day (TID) | ORAL | 0 refills | Status: DC | PRN

## 2018-12-09 MED ORDER — OXYCODONE HCL 5 MG PO TABS: 5.0000 mg | ORAL_TABLET | ORAL | 0 refills | Status: DC | PRN

## 2018-12-09 NOTE — Discharge Summary (Signed)
Physician Discharge Summary  Patient ID: Cynthia [name] MRN: [id number] DOB/AGE: 13-Aug-1980 37 y.o.  Admit date: 12/08/2018 Discharge date: 12/09/2018  Admission Diagnoses: S/p hysterectomy  Discharge Diagnoses:  Active Problems:   S/P hysterectomy   Discharged Condition: good  Hospital Course: unremarkable post op course  Consults: None  Significant Diagnostic Studies: labs:   Results for orders placed or performed during the hospital encounter of 12/08/18 (from the past 24 hour(s))  CBC     Status: Abnormal   Collection Time: 12/09/18  5:07 AM  Result Value Ref Range   WBC 9.3 4.0 - 10.5 K/uL   RBC 4.86 3.87 - 5.11 MIL/uL   Hemoglobin 11.1 (L) 12.0 - 15.0 g/dL   HCT 37.2 36.0 - 46.0 %   MCV 76.5 (L) 80.0 - 100.0 fL   MCH 22.8 (L) 26.0 - 34.0 pg   MCHC 29.8 (L) 30.0 - 36.0 g/dL   RDW Not Measured 11.5 - 15.5 %   Platelets 174 150 - 400 K/uL   nRBC 0.0 0.0 - 0.2 %  Basic metabolic panel     Status: Abnormal   Collection Time: 12/09/18  5:07 AM  Result Value Ref Range   Sodium 139 135 - 145 mmol/L   Potassium 3.6 3.5 - 5.1 mmol/L   Chloride 113 (H) 98 - 111 mmol/L   CO2 19 (L) 22 - 32 mmol/L   Glucose, Bld 115 (H) 70 - 99 mg/dL   BUN 6 6 - 20 mg/dL   Creatinine, Ser 0.81 0.44 - 1.00 mg/dL   Calcium 8.6 (L) 8.9 - 10.3 mg/dL   GFR calc non Af Amer >60 >60 mL/min   GFR calc Af Amer >60 >60 mL/min   Anion gap 7 5 - 15    Treatments: surgery: abdominal supracervical hysterectomy  Discharge Exam: Blood pressure 128/80, pulse 66, temperature 99.1 F (37.3 C), temperature source Oral, resp. rate 16, height 5\' 2"  (1.575 m), weight 69.4 kg, SpO2 100 %. General appearance: alert, cooperative and no distress GI: soft, non-tender; bowel sounds normal; no masses,  no organomegaly Incision/Wound:clean dry intact  Disposition: Discharge disposition: 01-Home or Self Care       Discharge Instructions    Call MD for:  persistant nausea and vomiting   Complete by: As  directed    Call MD for:  severe uncontrolled pain   Complete by: As directed    Call MD for:  temperature >100.4   Complete by: As directed    Diet - low sodium heart healthy   Complete by: As directed    Driving Restrictions   Complete by: As directed    No driving for 1 week   Increase activity slowly   Complete by: As directed    Leave dressing on - Keep it clean, dry, and intact until clinic visit   Complete by: As directed    Lifting restrictions   Complete by: As directed    Do not lift more than 10 pounds for 6 weeks   Sexual Activity Restrictions   Complete by: As directed    No sex for 6 weeks      Follow-up Information    Florian Buff, MD Follow up in 1 week(s).   Specialties: Obstetrics and Gynecology, Radiology Contact information: [place] [address] [phone]           Signed: Florian Buff 12/09/2018, 11:17 AM

## 2018-12-09 NOTE — Addendum Note (Signed)
Addendum  created 12/09/18 1525 by Ollen Bowl, CRNA   Charge Capture section accepted

## 2018-12-09 NOTE — Discharge Instructions (Signed)
Abdominal Hysterectomy, Care After °This sheet gives you information about how to care for yourself after your procedure. Your health care provider may also give you more specific instructions. If you have problems or questions, contact your health care provider. °What can I expect after the procedure? °After your procedure, it is common to have: °· Pain. °· Fatigue. °· Poor appetite. °· Less interest in sex. °· Vaginal bleeding and discharge. You may need to use a sanitary napkin after this procedure. °Follow these instructions at home: °Bathing °· Do not take baths, swim, or use a hot tub until your health care provider approves. Ask your health care provider if you can take showers. You may only be allowed to take sponge baths for bathing. °· Keep the bandage (dressing) dry until your health care provider says it can be removed. °Incision care ° °· Follow instructions from your health care provider about how to take care of your incision. Make sure you: °? Wash your hands with soap and water before you change your bandage (dressing). If soap and water are not available, use hand sanitizer. °? Change your dressing as told by your health care provider. °? Leave stitches (sutures), skin glue, or adhesive strips in place. These skin closures may need to stay in place for 2 weeks or longer. If adhesive strip edges start to loosen and curl up, you may trim the loose edges. Do not remove adhesive strips completely unless your health care provider tells you to do that. °· Check your incision area every day for signs of infection. Check for: °? Redness, swelling, or pain. °? Fluid or blood. °? Warmth. °? Pus or a bad smell. °Activity °· Do gentle, daily exercises as told by your health care provider. You may be told to take short walks every day and go farther each time. °· Do not lift anything that is heavier than 10 lb (4.5 kg), or the limit that your health care provider tells you, until he or she says that it is  safe. °· Do not drive or use heavy machinery while taking prescription pain medicine. °· Do not drive for 24 hours if you were given a medicine to help you relax (sedative). °· Follow your health care provider's instructions about exercise, driving, and general activities. Ask your health care provider what activities are safe for you. °Lifestyle °· Do not douche, use tampons, or have sex for at least 6 weeks or as told by your health care provider. °· Do not drink alcohol until your health care provider approves. °· Drink enough fluid to keep your urine clear or pale yellow. °· Try to have someone at home with you for the first 1-2 weeks to help. °· Do not use any products that contain nicotine or tobacco, such as cigarettes and e-cigarettes. These can delay healing. If you need help quitting, ask your health care provider. °General instructions °· Take over-the-counter and prescription medicines only as told by your health care provider. °· Do not take aspirin or ibuprofen. These medicines can cause bleeding. °· To prevent or treat constipation while you are taking prescription pain medicine, your health care provider may recommend that you: °? Drink enough fluid to keep your urine clear or pale yellow. °? Take over-the-counter or prescription medicines. °? Eat foods that are high in fiber, such as fresh fruits and vegetables, whole grains, and beans. °? Limit foods that are high in fat and processed sugars, such as fried and sweet foods. °· Keep all   follow-up visits as told by your health care provider. This is important. °Contact a health care provider if: °· You have chills or fever. °· You have redness, swelling, or pain around your incision. °· You have fluid or blood coming from your incision. °· Your incision feels warm to the touch. °· You have pus or a bad smell coming from your incision. °· Your incision breaks open. °· You feel dizzy or light-headed. °· You have pain or bleeding when you urinate. °· You  have persistent diarrhea. °· You have persistent nausea and vomiting. °· You have abnormal vaginal discharge. °· You have a rash. °· You have any type of abnormal reaction or you develop an allergy to your medicine. °· Your pain medicine does not help. °Get help right away if: °· You have a fever and your symptoms suddenly get worse. °· You have severe abdominal pain. °· You have shortness of breath. °· You faint. °· You have pain, swelling, or redness in your leg. °· You have heavy vaginal bleeding with blood clots. °Summary °· After your procedure, it is common to have pain, fatigue and vaginal discharge. °· Do not take baths, swim, or use a hot tub until your health care provider approves. Ask your health care provider if you can take showers. You may only be allowed to take sponge baths for bathing. °· Follow your health care provider's instructions about exercise, driving, and general activities. Ask your health care provider what activities are safe for you. °· Do not lift anything that is heavier than 10 lb (4.5 kg), or the limit that your health care provider tells you, until he or she says that it is safe. °· Try to have someone at home with you for the first 1-2 weeks to help. °This information is not intended to replace advice given to you by your health care provider. Make sure you discuss any questions you have with your health care provider. °Document Released: 07/11/2004 Document Revised: 01/25/2018 Document Reviewed: 12/11/2015 °Elsevier Patient Education © 2020 [employer] ° °

## 2018-12-11 LAB — BPAM RBC
Blood Product Expiration Date: 202012232359
Blood Product Expiration Date: 202012282359
Unit Type and Rh: 7300
Unit Type and Rh: 7300

## 2018-12-11 LAB — TYPE AND SCREEN
ABO/RH(D): B POS
Antibody Screen: POSITIVE
Unit division: 0
Unit division: 0

## 2018-12-12 ENCOUNTER — Telehealth: Payer: Self-pay | Admitting: Obstetrics & Gynecology

## 2018-12-12 MED ORDER — HYDROMORPHONE HCL 2 MG PO TABS: 2.0000 mg | ORAL_TABLET | ORAL | 0 refills | Status: DC | PRN

## 2018-12-13 LAB — SURGICAL PATHOLOGY

## 2018-12-14 ENCOUNTER — Encounter: Payer: Self-pay | Admitting: *Deleted

## 2018-12-14 NOTE — Telephone Encounter (Signed)
Pt states that she is unsure of return date. States that Dr. Elonda Husky would need to determine that.

## 2018-12-15 ENCOUNTER — Ambulatory Visit (INDEPENDENT_AMBULATORY_CARE_PROVIDER_SITE_OTHER): Payer: 59 | Admitting: Obstetrics & Gynecology

## 2018-12-15 ENCOUNTER — Other Ambulatory Visit: Payer: Self-pay

## 2018-12-15 ENCOUNTER — Encounter: Payer: Self-pay | Admitting: Obstetrics & Gynecology

## 2018-12-15 VITALS — BP 132/81 | HR 89 | Ht 62.0 in | Wt 144.0 lb

## 2018-12-15 DIAGNOSIS — Z90710 Acquired absence of both cervix and uterus: Secondary | ICD-10-CM

## 2018-12-15 DIAGNOSIS — D649 Anemia, unspecified: Secondary | ICD-10-CM

## 2018-12-15 DIAGNOSIS — Z9889 Other specified postprocedural states: Secondary | ICD-10-CM

## 2018-12-15 DIAGNOSIS — Z029 Encounter for administrative examinations, unspecified: Secondary | ICD-10-CM

## 2018-12-15 LAB — POCT HEMOGLOBIN: Hemoglobin: 10.6 g/dL — AB (ref 11–14.6)

## 2018-12-15 NOTE — Addendum Note (Signed)
Addended by: Linton Rump on: 12/15/2018 10:45 AM   Modules accepted: Orders

## 2018-12-15 NOTE — Progress Notes (Signed)
  HPI: Patient returns for routine postoperative follow-up having undergone abdominal supracervical hysterectomy  on 12/08/2018.  The patient's immediate postoperative recovery has been unremarkable. Since hospital discharge the patient reports some constipation, voiding normally, minimal brownish vaginal bleeding.   Current Outpatient Medications: .  HYDROmorphone (DILAUDID) 2 MG tablet, Take 1 tablet (2 mg total) by mouth every 4 (four) hours as needed for severe pain., Disp: 30 tablet, Rfl: 0 .  ketorolac (TORADOL) 10 MG tablet, Take 1 tablet (10 mg total) by mouth every 8 (eight) hours as needed., Disp: 15 tablet, Rfl: 0 .  ondansetron (ZOFRAN) 8 MG tablet, Take 1 tablet (8 mg total) by mouth every 6 (six) hours as needed for nausea., Disp: 20 tablet, Rfl: 0  No current facility-administered medications for this visit.    Blood pressure 132/81, pulse 89, height 5\' 2"  (1.575 m), weight 144 lb (65.3 kg), last menstrual period 10/19/2018.  Physical Exam: Incision clean dry intact Some abdominal bloating benign minimally tender  Fingerstick hemoglobin 10.6  Diagnostic Tests:   Pathology: benign  Impression: S/p abdominal supracervical hysterectomy  ITP  Plan:   Follow up: 4  weeks  Florian Buff, MD

## 2018-12-22 ENCOUNTER — Emergency Department (HOSPITAL_COMMUNITY): Payer: 59

## 2018-12-22 ENCOUNTER — Encounter (HOSPITAL_COMMUNITY): Payer: Self-pay | Admitting: *Deleted

## 2018-12-22 ENCOUNTER — Other Ambulatory Visit: Payer: Self-pay

## 2018-12-22 ENCOUNTER — Emergency Department (HOSPITAL_COMMUNITY)
Admission: EM | Admit: 2018-12-22 | Discharge: 2018-12-22 | Disposition: A | Discharge status: Home / Self Care | Payer: 59 | Attending: Emergency Medicine | Admitting: Emergency Medicine

## 2018-12-22 DIAGNOSIS — R110 Nausea: Secondary | ICD-10-CM | POA: Insufficient documentation

## 2018-12-22 DIAGNOSIS — G8918 Other acute postprocedural pain: Secondary | ICD-10-CM | POA: Insufficient documentation

## 2018-12-22 DIAGNOSIS — R339 Retention of urine, unspecified: Secondary | ICD-10-CM | POA: Diagnosis not present

## 2018-12-22 DIAGNOSIS — R102 Pelvic and perineal pain: Secondary | ICD-10-CM | POA: Insufficient documentation

## 2018-12-22 DIAGNOSIS — R350 Frequency of micturition: Secondary | ICD-10-CM | POA: Insufficient documentation

## 2018-12-22 LAB — COMPREHENSIVE METABOLIC PANEL
ALT: 25 U/L (ref 0–44)
AST: 22 U/L (ref 15–41)
Albumin: 4.1 g/dL (ref 3.5–5.0)
Alkaline Phosphatase: 87 U/L (ref 38–126)
Anion gap: 9 (ref 5–15)
BUN: 19 mg/dL (ref 6–20)
CO2: 20 mmol/L — ABNORMAL LOW (ref 22–32)
Calcium: 9.5 mg/dL (ref 8.9–10.3)
Chloride: 103 mmol/L (ref 98–111)
Creatinine, Ser: 1 mg/dL (ref 0.44–1.00)
GFR calc Af Amer: >60 mL/min (ref >60)
GFR calc non Af Amer: >60 mL/min (ref >60)
Glucose, Bld: 97 mg/dL (ref 70–99)
Potassium: 3.5 mmol/L (ref 3.5–5.1)
Sodium: 132 mmol/L — ABNORMAL LOW (ref 135–145)
Total Bilirubin: 0.8 mg/dL (ref 0.3–1.2)
Total Protein: 8.2 g/dL — ABNORMAL HIGH (ref 6.5–8.1)

## 2018-12-22 LAB — URINALYSIS, ROUTINE W REFLEX MICROSCOPIC
Bilirubin Urine: NEGATIVE
Glucose, UA: NEGATIVE mg/dL
Ketones, ur: NEGATIVE mg/dL
Nitrite: NEGATIVE
Protein, ur: 30 mg/dL — AB
Specific Gravity, Urine: 1.016 (ref 1.005–1.030)
pH: 7 (ref 5.0–8.0)

## 2018-12-22 LAB — CBC WITH DIFFERENTIAL/PLATELET
Abs Immature Granulocytes: 0.09 10*3/uL — ABNORMAL HIGH (ref 0.00–0.07)
Basophils Absolute: 0.2 10*3/uL — ABNORMAL HIGH (ref 0.0–0.1)
Basophils Relative: 1 %
Eosinophils Absolute: 0.3 10*3/uL (ref 0.0–0.5)
Eosinophils Relative: 2 %
HCT: 44.7 % (ref 36.0–46.0)
Hemoglobin: 14 g/dL (ref 12.0–15.0)
Immature Granulocytes: 1 %
Lymphocytes Relative: 28 %
Lymphs Abs: 3.9 10*3/uL (ref 0.7–4.0)
MCH: 23.8 pg — ABNORMAL LOW (ref 26.0–34.0)
MCHC: 31.3 g/dL (ref 30.0–36.0)
MCV: 76 fL — ABNORMAL LOW (ref 80.0–100.0)
Monocytes Absolute: 1.3 10*3/uL — ABNORMAL HIGH (ref 0.1–1.0)
Monocytes Relative: 9 %
Neutro Abs: 8.2 10*3/uL — ABNORMAL HIGH (ref 1.7–7.7)
Neutrophils Relative %: 59 %
Platelets: 282 10*3/uL (ref 150–400)
RBC: 5.88 MIL/uL — ABNORMAL HIGH (ref 3.87–5.11)
WBC: 13.9 10*3/uL — ABNORMAL HIGH (ref 4.0–10.5)
nRBC: 0 % (ref 0.0–0.2)

## 2018-12-22 LAB — LIPASE, BLOOD: Lipase: 37 U/L (ref 11–51)

## 2018-12-22 MED ORDER — IOHEXOL 300 MG/ML  SOLN
100.0000 mL | Freq: Once | INTRAMUSCULAR | Status: AC | PRN
  Administered 2018-12-22: 100 mL via INTRAVENOUS

## 2018-12-22 MED ORDER — SODIUM CHLORIDE 0.9 % IV BOLUS
1000.0000 mL | Freq: Once | INTRAVENOUS | Status: AC
  Administered 2018-12-22: 1000 mL via INTRAVENOUS

## 2018-12-22 MED ORDER — SODIUM CHLORIDE 0.9 % IV SOLN
2.0000 g | Freq: Once | INTRAVENOUS | Status: AC
  Administered 2018-12-22: 2 g via INTRAVENOUS
  Filled 2018-12-22: qty 20

## 2018-12-22 MED ORDER — ENOXAPARIN SODIUM 40 MG/0.4ML ~~LOC~~ SOLN
40.0000 mg | Freq: Once | SUBCUTANEOUS | Status: AC
  Administered 2018-12-22: 40 mg via SUBCUTANEOUS
  Filled 2018-12-22: qty 0.4

## 2018-12-22 MED ORDER — ONDANSETRON HCL 4 MG/2ML IJ SOLN
4.0000 mg | Freq: Once | INTRAMUSCULAR | Status: AC
  Administered 2018-12-22: 4 mg via INTRAVENOUS
  Filled 2018-12-22: qty 2

## 2018-12-22 MED ORDER — MORPHINE SULFATE (PF) 4 MG/ML IV SOLN
4.0000 mg | Freq: Once | INTRAVENOUS | Status: AC
  Administered 2018-12-22: 4 mg via INTRAVENOUS
  Filled 2018-12-22: qty 1

## 2018-12-22 NOTE — ED Provider Notes (Signed)
[facility] Provider Note   CSN: [id number] Arrival date & time: 12/22/18  1619     History Chief Complaint  Patient presents with  . Abdominal Pain    Cynthia [name] is a 38 y.o. female.  38 year old female with complaint of lower abdominal pain.  Patient is 2 weeks status post precervical hysterectomy, states she been doing well with her recovery until today when she felt spasming pains in her lower pelvis while resting.  Pain started this morning, she took one of her Dilaudid pills and pain resolved, pain returned when her medication wore off, patient took medication again without relief of her pain.  Patient states pain is intermittent, nothing makes pain better or worse.  Patient reports urinary frequency without dysuria or hematuria.  Patient reports normal bowel movements.  Patient denies fevers, chills, vomiting states she has been slightly nauseous.  No other complaints or concerns.        Past Medical History:  Diagnosis Date  . History of ITP     Patient Active Problem List   Diagnosis Date Noted  . S/P hysterectomy 11/09/2018  . Iron deficiency anemia due to chronic blood loss 11/09/2018    Past Surgical History:  Procedure Laterality Date  . CHOLECYSTECTOMY    . GALLBLADDER SURGERY    . SUPRACERVICAL ABDOMINAL HYSTERECTOMY N/A 12/08/2018   Procedure: HYSTERECTOMY SUPRACERVICAL ABDOMINAL;  Surgeon: Florian Buff, MD;  Location: [facility];  Service: Gynecology;  Laterality: N/A;  . TUBAL LIGATION       OB History    Gravida  6   Para  5   Term  4   Preterm  1   AB  1   Living  3     SAB  1   TAB      Ectopic      Multiple      Live Births  3           Family History  Problem Relation Age of Onset  . Aneurysm Maternal Grandmother   . Diabetes Father   . Breast cancer Mother   . Hypertension Sister     Social History   Tobacco Use  . Smoking status: Never Smoker  . Smokeless tobacco: Never Used    Substance Use Topics  . Alcohol use: Not Currently    Comment: "every now and then"   . Drug use: Not Currently    Types: Marijuana    Comment: Last used July 2020.    Home Medications Prior to Admission medications   Medication Sig Start Date End Date Taking? Authorizing Provider  HYDROmorphone (DILAUDID) 2 MG tablet Take 1 tablet (2 mg total) by mouth every 4 (four) hours as needed for severe pain. 12/12/18   Florian Buff, MD  ketorolac (TORADOL) 10 MG tablet Take 1 tablet (10 mg total) by mouth every 8 (eight) hours as needed. 12/09/18   Florian Buff, MD  ondansetron (ZOFRAN) 8 MG tablet Take 1 tablet (8 mg total) by mouth every 6 (six) hours as needed for nausea. 12/09/18   Florian Buff, MD    Allergies    Patient has no known allergies.  Review of Systems   Review of Systems  Constitutional: Negative for chills, diaphoresis and fever.  Respiratory: Negative for shortness of breath.   Cardiovascular: Negative for chest pain.  Gastrointestinal: Positive for nausea. Negative for abdominal pain, constipation, diarrhea and vomiting.  Genitourinary: Positive for frequency and pelvic pain.  Negative for dysuria, hematuria, vaginal bleeding and vaginal discharge.  Musculoskeletal: Negative for arthralgias, back pain and myalgias.  Skin: Negative for rash and wound.  Allergic/Immunologic: Negative for immunocompromised state.  Neurological: Negative for weakness.  Psychiatric/Behavioral: Negative for confusion.  All other systems reviewed and are negative.   Physical Exam Updated Vital Signs BP 104/80   Pulse 95   Temp 98.3 F (36.8 C) (Oral)   Resp 18   LMP 10/19/2018   SpO2 100%   Physical Exam Vitals and nursing note reviewed.  Constitutional:      General: She is not in acute distress.    Appearance: She is well-developed. She is not diaphoretic.  HENT:     Head: Normocephalic and atraumatic.  Cardiovascular:     Rate and Rhythm: Normal rate and regular rhythm.      Heart sounds: Normal heart sounds. No murmur.     Comments: Echocardiac in triage, heart rate currently 90, regular rate and rhythm. Pulmonary:     Effort: Pulmonary effort is normal.     Breath sounds: Normal breath sounds.  Abdominal:     Palpations: Abdomen is soft.     Tenderness: There is abdominal tenderness in the suprapubic area. There is no right CVA tenderness or left CVA tenderness.     Comments: Prepubic incision with Dermabond in place, no redness or signs of infection, no dehiscence.  Skin:    General: Skin is warm and dry.     Findings: No rash.  Neurological:     Mental Status: She is alert and oriented to person, place, and time.  Psychiatric:        Behavior: Behavior normal.     ED Results / Procedures / Treatments   Labs (all labs ordered are listed, but only abnormal results are displayed) Labs Reviewed  COMPREHENSIVE METABOLIC PANEL - Abnormal; Notable for the following components:      Result Value   Sodium 132 (*)    CO2 20 (*)    Total Protein 8.2 (*)    All other components within normal limits  CBC WITH DIFFERENTIAL/PLATELET - Abnormal; Notable for the following components:   WBC 13.9 (*)    RBC 5.88 (*)    MCV 76.0 (*)    MCH 23.8 (*)    Neutro Abs 8.2 (*)    Monocytes Absolute 1.3 (*)    Basophils Absolute 0.2 (*)    Abs Immature Granulocytes 0.09 (*)    All other components within normal limits  URINALYSIS, ROUTINE W REFLEX MICROSCOPIC - Abnormal; Notable for the following components:   APPearance HAZY (*)    Hgb urine dipstick MODERATE (*)    Protein, ur 30 (*)    Leukocytes,Ua MODERATE (*)    Bacteria, UA RARE (*)    All other components within normal limits  LIPASE, BLOOD    EKG None  Radiology CT Abdomen Pelvis W Contrast  Result Date: 12/22/2018 CLINICAL DATA:  Pelvic pain.  Two weeks post hysterectomy. EXAM: CT ABDOMEN AND PELVIS WITH CONTRAST TECHNIQUE: Multidetector CT imaging of the abdomen and pelvis was performed  using the standard protocol following bolus administration of intravenous contrast. CONTRAST:  146mL OMNIPAQUE IOHEXOL 300 MG/ML  SOLN COMPARISON:  July 23, 2006 FINDINGS: Lower chest: The lung bases are clear. The heart size is normal. Hepatobiliary: The patient is status post cholecystectomy. There is intrahepatic biliary ductal dilatation appears to be isolated to the left hepatic lobe. The common bile duct and pancreatic duct are  both slightly dilated. These findings appear to be new since 2008. The patient was also status post cholecystectomy in 2008. Pancreas: Pancreatic duct is slightly dilated. Spleen: No splenic laceration or hematoma. Adrenals/Urinary Tract: --Adrenal glands: There is a 1.5 cm left adrenal mass. --Right kidney/ureter: There is a nonobstructing stone in the upper pole the right kidney. There is some mild wall thickening and enhancement of the right ureter. There is no definite evidence for obstructing stone in the right ureter. --Left kidney/ureter: No hydronephrosis or perinephric hematoma. --Urinary bladder: There is diffuse wall thickening of the urinary bladder. 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 Stomach/Bowel: --Stomach/Duodenum: No hiatal hernia or other gastric abnormality. Normal duodenal course and caliber. --Small bowel: No dilatation or inflammation. --Colon: No focal abnormality. --Appendix: Normal. Vascular/Lymphatic: The aorta is unremarkable. There appears to be a filling defect within the right ovarian vein. --No retroperitoneal lymphadenopathy. --No mesenteric lymphadenopathy. --No pelvic or inguinal lymphadenopathy. Reproductive: The patient is status post recent supracervical hysterectomy. The surgical suture line superiorly appears to have dehisced. This is best appreciated on axial series 7 images 50 through 56 there are inflammatory changes in the patient's pelvis. There is a small amount of free fluid.  There is a 2.7 x 1.3 cm cystic structure in the left hemipelvis (axial series 2, image 54). The right ovary appears complex with multiple cystic areas. Other: There is a small amount of free fluid in the patient's pelvis. The abdominal wall is normal. Musculoskeletal. No acute displaced fractures. IMPRESSION: 1. Status post recent supracervical hysterectomy with findings concerning for dehiscence of the superior suture line as detailed above. 2. Complex fluid collection adjacent to the left ovary is concerning for a developing abscess. The right ovary is heterogeneous with multiple cystic areas which are not well evaluated on this exam. Findings could represent developing hydrosalpinx or pyosalpinx. 3. Diffuse wall thickening of the urinary bladder with mild right-sided collecting system dilatation and enhancement of the right ureter is concerning for cystitis with an ascending urinary tract infection on the right. 4. Right ovarian vein thrombophlebitis. 5. New intrahepatic biliary ductal dilatation, primarily within the left hepatic lobe of unknown clinical significance. Correlation with an outpatient MRI is recommended. 6. Indeterminate 1.5 cm left adrenal mass. Consider follow-up with an outpatient adrenal mass protocol CT or MRI. These results were called by telephone at the time of interpretation on 12/22/2018 at 9:57 pm to provider [facility] , who verbally acknowledged these results. Electronically Signed   By: Constance Holster M.D.   On: 12/22/2018 22:09    Procedures Procedures (including critical care time)  Medications Ordered in ED Medications  cefTRIAXone (ROCEPHIN) 2 g in sodium chloride 0.9 % 100 mL IVPB (2 g Intravenous New Bag/Given 12/22/18 2253)  sodium chloride 0.9 % bolus 1,000 mL (0 mLs Intravenous Stopped 12/22/18 2254)  morphine 4 MG/ML injection 4 mg (4 mg Intravenous Given 12/22/18 2109)  ondansetron (ZOFRAN) injection 4 mg (4 mg Intravenous Given 12/22/18 2109)  iohexol  (OMNIPAQUE) 300 MG/ML solution 100 mL (100 mLs Intravenous Contrast Given 12/22/18 2127)  enoxaparin (LOVENOX) injection 40 mg (40 mg Subcutaneous Given 12/22/18 2254)    ED Course  I have reviewed the triage vital signs and the nursing notes.  Pertinent labs & imaging results that were available during my care of the patient were reviewed by me and considered in my medical decision making (see chart for details).  Clinical Course as of Dec 21 2301  Thu Dec 21, 6480  3458 38 year old female with complaint of pelvic pain 2 weeks status post supracervical hysterectomy.  On exam patient's wound is healing well, no evidence of dehiscence, no overlying erythema, no drainage.  Patient has tenderness in the suprapubic area on exam.  CBC with leukocytosis with white count of 13.9, hemoglobin of 14, platelets 282 (patient has history of ITP).  CMP without significant changes.  Urinalysis with moderate hemoglobin, moderate leukocytes with 6-10 white blood cells and rare bacteria, contaminated sample, no complaints of dysuria.  Patient's initial triage vitals show tachycardia with heart rate of 134, afebrile.  Repeat vital signs, heart rate has improved to the 90s.    [LM]  2232 Call from radiologist regarding CT abdomen and pelvis with concern for developing infection, see full report. Called to Dr. Sherin Quarry, on-call with gynecology and patient's surgeon, has reviewed CT images and report, reviewed lab work and history of patient's presentation today, recommends 40 mg subcu Lovenox and 2 g of Rocephin IV with plan to follow-up in the office tomorrow.  Offered to refill patient's Toradol and Dilaudid, she states that she has plenty of pain medication at home and does not need this refilled today.  Patient will contact her providers office in the morning to schedule appointment for tomorrow.     [LM]  2301 Dr. Sherin Quarry called back after reviewing CT images, recommends post void residual, found to have 512-530mL on  post void bladder scan. Recommends, in and out cath, discussed follow up with patient.  Also reviewed incidental findings on CT, Dr. Elonda Husky aware.    [LM]    Clinical Course User Index [LM] Roque Lias   MDM Rules/Calculators/A&P                       Final Clinical Impression(s) / ED Diagnoses Final diagnoses:  Pelvic pain  Post-operative pain  Urinary retention    Rx / DC Orders ED Discharge Orders    None       Tacy Learn, PA-C 12/23/18 Evalee Mutton, MD 12/24/18 [phone]

## 2018-12-22 NOTE — Discharge Instructions (Addendum)
Continue with your pain medications as prescribed. Follow up with Dr. Elonda Husky tomorrow, call in the morning for an appointment.

## 2018-12-22 NOTE — ED Triage Notes (Signed)
Abdomina pain, states she had a hysterectomy 2 weeks ago, and was placed on dilaudid 2 mg. States she took dilaudid this am without relief

## 2018-12-23 ENCOUNTER — Ambulatory Visit (INDEPENDENT_AMBULATORY_CARE_PROVIDER_SITE_OTHER): Payer: 59 | Admitting: Obstetrics & Gynecology

## 2018-12-23 ENCOUNTER — Encounter: Payer: Self-pay | Admitting: Obstetrics & Gynecology

## 2018-12-23 ENCOUNTER — Other Ambulatory Visit: Payer: Self-pay

## 2018-12-23 VITALS — BP 135/83 | Ht 62.0 in | Wt 131.0 lb

## 2018-12-23 DIAGNOSIS — Z90710 Acquired absence of both cervix and uterus: Secondary | ICD-10-CM

## 2018-12-23 DIAGNOSIS — R339 Retention of urine, unspecified: Secondary | ICD-10-CM

## 2018-12-23 LAB — POCT URINALYSIS DIPSTICK OB
Glucose, UA: NEGATIVE
Leukocytes, UA: NEGATIVE
Nitrite, UA: NEGATIVE

## 2018-12-23 NOTE — Patient Instructions (Addendum)
.  self

## 2018-12-23 NOTE — Progress Notes (Signed)
  HPI: Patient returns for routine postoperative follow-up having undergone supracervical hysterectomy on 12/08/18.  The patient's immediate postoperative recovery has been unremarkable. Since hospital discharge the patient reports see ED note from last night, I was in communication with PA and patient last night.   Current Outpatient Medications: .  HYDROmorphone (DILAUDID) 2 MG tablet, Take 1 tablet (2 mg total) by mouth every 4 (four) hours as needed for severe pain., Disp: 30 tablet, Rfl: 0 .  ketorolac (TORADOL) 10 MG tablet, Take 1 tablet (10 mg total) by mouth every 8 (eight) hours as needed., Disp: 15 tablet, Rfl: 0 .  ondansetron (ZOFRAN) 8 MG tablet, Take 1 tablet (8 mg total) by mouth every 6 (six) hours as needed for nausea. (Patient not taking: Reported on 12/23/2018), Disp: 20 tablet, Rfl: 0  No current facility-administered medications for this visit.    Height 5\' 2"  (1.575 m), weight 131 lb (59.4 kg), last menstrual period 10/19/2018.  Physical Exam: Incision clean dry intact   Diagnostic Tests:  Reviewed all labs and CT scan from ED last night CT reveals urinary retention   Pathology: benign  Impression: Post operative urinary retention,  Unusual 2 weeks out in a supracervical hysterectomy Hx of UTIs wonder if may have some degree of chronic retention  Plan:   Follow up: Keep appt    Florian Buff, MD

## 2019-01-12 ENCOUNTER — Encounter: Payer: Self-pay | Admitting: Obstetrics & Gynecology

## 2019-01-12 ENCOUNTER — Ambulatory Visit (INDEPENDENT_AMBULATORY_CARE_PROVIDER_SITE_OTHER): Payer: 59 | Admitting: Obstetrics & Gynecology

## 2019-01-12 ENCOUNTER — Other Ambulatory Visit: Payer: Self-pay

## 2019-01-12 VITALS — BP 115/77 | HR 55 | Ht 62.0 in | Wt 134.0 lb

## 2019-01-12 DIAGNOSIS — Z9889 Other specified postprocedural states: Secondary | ICD-10-CM

## 2019-01-12 DIAGNOSIS — Z90710 Acquired absence of both cervix and uterus: Secondary | ICD-10-CM

## 2019-01-12 NOTE — Progress Notes (Signed)
  HPI: Patient returns for routine postoperative follow-up having undergone abdominal supracervical hysterectomy 12/08/2018 on .  The patient's immediate postoperative recovery has been unremarkable. Since hospital discharge the patient reports no problems.   No current outpatient medications on file. No current facility-administered medications for this visit.    Blood pressure 115/77, pulse (!) 55, height 5\' 2"  (1.575 m), weight 134 lb (60.8 kg), last menstrual period 10/19/2018.  Physical Exam: Incision clean dry intact  Diagnostic Tests:   Pathology: benign  Impression: S/p supracervical hysterectomy  Plan:   Follow up: 1  years  Florian Buff, MD

## 2019-03-28 ENCOUNTER — Ambulatory Visit: Payer: Self-pay | Admitting: Obstetrics & Gynecology

## 2019-04-17 ENCOUNTER — Ambulatory Visit: Payer: Self-pay | Admitting: Obstetrics & Gynecology

## 2019-06-15 ENCOUNTER — Emergency Department (HOSPITAL_COMMUNITY)
Admission: EM | Admit: 2019-06-15 | Discharge: 2019-06-15 | Disposition: A | Discharge status: Home / Self Care | Payer: Self-pay | Attending: Emergency Medicine | Admitting: Emergency Medicine

## 2019-06-15 ENCOUNTER — Encounter (HOSPITAL_COMMUNITY): Payer: Self-pay | Admitting: Emergency Medicine

## 2019-06-15 ENCOUNTER — Other Ambulatory Visit: Payer: Self-pay

## 2019-06-15 DIAGNOSIS — M545 Low back pain, unspecified: Secondary | ICD-10-CM

## 2019-06-15 DIAGNOSIS — R1030 Lower abdominal pain, unspecified: Secondary | ICD-10-CM | POA: Insufficient documentation

## 2019-06-15 LAB — URINALYSIS, ROUTINE W REFLEX MICROSCOPIC
Bacteria, UA: NONE SEEN
Bilirubin Urine: NEGATIVE
Glucose, UA: NEGATIVE mg/dL
Ketones, ur: NEGATIVE mg/dL
Nitrite: NEGATIVE
Protein, ur: NEGATIVE mg/dL
Specific Gravity, Urine: 1.015 (ref 1.005–1.030)
pH: 7 (ref 5.0–8.0)

## 2019-06-15 LAB — PREGNANCY, URINE: Preg Test, Ur: NEGATIVE

## 2019-06-15 MED ORDER — OXYCODONE-ACETAMINOPHEN 5-325 MG PO TABS
1.0000 | ORAL_TABLET | Freq: Once | ORAL | Status: DC
  Filled 2019-06-15: qty 1

## 2019-06-15 MED ORDER — METHOCARBAMOL 500 MG PO TABS: 500.0000 mg | ORAL_TABLET | Freq: Three times a day (TID) | ORAL | 0 refills | Status: DC | PRN

## 2019-06-15 MED ORDER — IBUPROFEN 400 MG PO TABS
600.0000 mg | ORAL_TABLET | Freq: Once | ORAL | Status: AC
  Administered 2019-06-15: 600 mg via ORAL
  Filled 2019-06-15: qty 2

## 2019-06-15 NOTE — ED Provider Notes (Signed)
[facility] Provider Note   CSN: [id number] Arrival date & time: 06/15/19  8563     History Chief Complaint  Patient presents with  . Flank Pain    Cynthia [name] is a 39 y.o. female.  HPI   12yF with lower back pain. Onset while laying in bed on Tuesday evening. Persistent since. Denies trauma/strain. Pain waxes and wanes. Worse initially with movement but says that once she is up and moving for a bit the pain actually improves. Now starting to wrap around into flank and lower abdomen. No unusual vaginal bleeding or discharge. No urinary complaints. No hx of prior back problems. Tried ibuprofen w/o significant improvement.   Past Medical History:  Diagnosis Date  . History of ITP     Patient Active Problem List   Diagnosis Date Noted  . S/P hysterectomy 11/09/2018  . Iron deficiency anemia due to chronic blood loss 11/09/2018    Past Surgical History:  Procedure Laterality Date  . CHOLECYSTECTOMY    . GALLBLADDER SURGERY    . SUPRACERVICAL ABDOMINAL HYSTERECTOMY N/A 12/08/2018   Procedure: HYSTERECTOMY SUPRACERVICAL ABDOMINAL;  Surgeon: Florian Buff, MD;  Location: [facility];  Service: Gynecology;  Laterality: N/A;  . TUBAL LIGATION       OB History    Gravida  6   Para  5   Term  4   Preterm  1   AB  1   Living  3     SAB  1   TAB      Ectopic      Multiple      Live Births  3           Family History  Problem Relation Age of Onset  . Aneurysm Maternal Grandmother   . Diabetes Father   . Breast cancer Mother   . Hypertension Sister     Social History   Tobacco Use  . Smoking status: Never Smoker  . Smokeless tobacco: Never Used  Vaping Use  . Vaping Use: Never used  Substance Use Topics  . Alcohol use: Not Currently    Comment: "every now and then"   . Drug use: Not Currently    Types: Marijuana    Comment: Last used July 2020.    Home Medications Prior to Admission medications   Not on File     Allergies    Patient has no known allergies.  Review of Systems   Review of Systems All systems reviewed and negative, other than as noted in HPI.  Physical Exam Updated Vital Signs BP 122/83 (BP Location: Left Arm)   Pulse 68   Temp 97.9 F (36.6 C) (Oral)   Resp 16   Ht 5\' 2"  (1.575 m)   Wt 67 kg   LMP 10/19/2018   SpO2 99%   BMI 27.02 kg/m   Physical Exam Vitals and nursing note reviewed.  Constitutional:      General: She is not in acute distress.    Appearance: She is well-developed.  HENT:     Head: Normocephalic and atraumatic.  Eyes:     General:        Right eye: No discharge.        Left eye: No discharge.     Conjunctiva/sclera: Conjunctivae normal.  Cardiovascular:     Rate and Rhythm: Normal rate and regular rhythm.     Heart sounds: Normal heart sounds. No murmur heard.  No friction rub. No gallop.  Pulmonary:     Effort: Pulmonary effort is normal. No respiratory distress.     Breath sounds: Normal breath sounds.  Abdominal:     General: There is no distension.     Palpations: Abdomen is soft.     Tenderness: There is no abdominal tenderness.  Musculoskeletal:        General: Tenderness present.     Cervical back: Neck supple.     Comments: Mild ttp R lumbar paraspinal musculature. No midline spinal tenderness. No CVA tenderness. Mild ttp suprapubically w/o rebound or guarding.   Skin:    General: Skin is warm and dry.  Neurological:     General: No focal deficit present.     Mental Status: She is alert.     Motor: No weakness.  Psychiatric:        Behavior: Behavior normal.        Thought Content: Thought content normal.     ED Results / Procedures / Treatments   Labs (all labs ordered are listed, but only abnormal results are displayed) Labs Reviewed  URINALYSIS, ROUTINE W REFLEX MICROSCOPIC - Abnormal; Notable for the following components:      Result Value   Hgb urine dipstick SMALL (*)    Leukocytes,Ua TRACE (*)    All  other components within normal limits  PREGNANCY, URINE    EKG None  Radiology No results found.  Procedures Procedures (including critical care time)  Medications Ordered in ED Medications  ibuprofen (ADVIL) tablet 600 mg (has no administration in time range)  oxyCODONE-acetaminophen (PERCOCET/ROXICET) 5-325 MG per tablet 1 tablet (has no administration in time range)    ED Course  I have reviewed the triage vital signs and the nursing notes.  Pertinent labs & imaging results that were available during my care of the patient were reviewed by me and considered in my medical decision making (see chart for details).    MDM Rules/Calculators/A&P                          38yF with atraumatic lower back pain w/o red flags. Not pregnant. UA fine. Symptomatic tx. Activity as tolerated. Return precautions discussed.    Final Clinical Impression(s) / ED Diagnoses Final diagnoses:  Acute midline low back pain without sciatica    Rx / DC Orders ED Discharge Orders    None       Virgel Manifold, MD 06/15/19 [phone]

## 2019-06-15 NOTE — ED Triage Notes (Signed)
Pt c/o lower back pain that wraps around to lower stomach. Pt states this started Tuesday night and has progressively gotten worse. Pt took ibuprofen with no relief.

## 2019-06-15 NOTE — Discharge Instructions (Signed)
I suspect your back pain is muscular. Sometimes this can take a few weeks to resolve. Continue activity as tolerated. I'm fine with your working but, if you feel you need a day or two off, a work not was provided. I know you didn't feel much better when you took a dose of ibuprofen but I want you to continue to try it. Use 600 mg every 6 hours as needed. Use the prescribed muscle relaxant as needed.

## 2021-03-23 DIAGNOSIS — K838 Other specified diseases of biliary tract: Secondary | ICD-10-CM | POA: Insufficient documentation

## 2021-03-23 DIAGNOSIS — R7401 Elevation of levels of liver transaminase levels: Secondary | ICD-10-CM | POA: Insufficient documentation

## 2021-03-23 DIAGNOSIS — M546 Pain in thoracic spine: Secondary | ICD-10-CM | POA: Insufficient documentation

## 2021-03-24 ENCOUNTER — Emergency Department (HOSPITAL_COMMUNITY): Payer: Self-pay

## 2021-03-24 ENCOUNTER — Emergency Department (HOSPITAL_COMMUNITY)
Admission: EM | Admit: 2021-03-24 | Discharge: 2021-03-24 | Disposition: A | Discharge status: Home / Self Care | Payer: Self-pay | Attending: Emergency Medicine | Admitting: Emergency Medicine

## 2021-03-24 DIAGNOSIS — R109 Unspecified abdominal pain: Secondary | ICD-10-CM

## 2021-03-24 DIAGNOSIS — R748 Abnormal levels of other serum enzymes: Secondary | ICD-10-CM

## 2021-03-24 DIAGNOSIS — K838 Other specified diseases of biliary tract: Secondary | ICD-10-CM

## 2021-03-24 LAB — COMPREHENSIVE METABOLIC PANEL
ALT: 95 U/L — ABNORMAL HIGH (ref 0–44)
AST: 253 U/L — ABNORMAL HIGH (ref 15–41)
Albumin: 4.1 g/dL (ref 3.5–5.0)
Alkaline Phosphatase: 95 U/L (ref 38–126)
Anion gap: 11 (ref 5–15)
BUN: 17 mg/dL (ref 6–20)
CO2: 22 mmol/L (ref 22–32)
Calcium: 8.8 mg/dL — ABNORMAL LOW (ref 8.9–10.3)
Chloride: 104 mmol/L (ref 98–111)
Creatinine, Ser: 0.9 mg/dL (ref 0.44–1.00)
GFR, Estimated: >60 mL/min (ref >60)
Glucose, Bld: 102 mg/dL — ABNORMAL HIGH (ref 70–99)
Potassium: 3.4 mmol/L — ABNORMAL LOW (ref 3.5–5.1)
Sodium: 137 mmol/L (ref 135–145)
Total Bilirubin: 0.7 mg/dL (ref 0.3–1.2)
Total Protein: 7.5 g/dL (ref 6.5–8.1)

## 2021-03-24 LAB — CBC WITH DIFFERENTIAL/PLATELET
Abs Immature Granulocytes: 0.03 10*3/uL (ref 0.00–0.07)
Basophils Absolute: 0 10*3/uL (ref 0.0–0.1)
Basophils Relative: 1 %
Eosinophils Absolute: 0.2 10*3/uL (ref 0.0–0.5)
Eosinophils Relative: 2 %
HCT: 45.1 % (ref 36.0–46.0)
Hemoglobin: 15 g/dL (ref 12.0–15.0)
Immature Granulocytes: 0 %
Lymphocytes Relative: 22 %
Lymphs Abs: 1.5 10*3/uL (ref 0.7–4.0)
MCH: 28 pg (ref 26.0–34.0)
MCHC: 33.3 g/dL (ref 30.0–36.0)
MCV: 84.1 fL (ref 80.0–100.0)
Monocytes Absolute: 0.6 10*3/uL (ref 0.1–1.0)
Monocytes Relative: 9 %
Neutro Abs: 4.5 10*3/uL (ref 1.7–7.7)
Neutrophils Relative %: 66 %
Platelets: 152 10*3/uL (ref 150–400)
RBC: 5.36 MIL/uL — ABNORMAL HIGH (ref 3.87–5.11)
RDW: 13.9 % (ref 11.5–15.5)
WBC: 6.8 10*3/uL (ref 4.0–10.5)
nRBC: 0 % (ref 0.0–0.2)

## 2021-03-24 LAB — URINALYSIS, ROUTINE W REFLEX MICROSCOPIC
Bilirubin Urine: NEGATIVE
Glucose, UA: NEGATIVE mg/dL
Ketones, ur: NEGATIVE mg/dL
Nitrite: NEGATIVE
Protein, ur: 30 mg/dL — AB
Specific Gravity, Urine: 1.023 (ref 1.005–1.030)
pH: 6 (ref 5.0–8.0)

## 2021-03-24 LAB — LIPASE, BLOOD: Lipase: 28 U/L (ref 11–51)

## 2021-03-24 MED ORDER — OXYCODONE HCL 5 MG PO TABS: 5.0000 mg | ORAL_TABLET | Freq: Four times a day (QID) | ORAL | 0 refills | Status: DC | PRN

## 2021-03-24 MED ORDER — LACTATED RINGERS IV BOLUS
1000.0000 mL | Freq: Once | INTRAVENOUS | Status: AC
  Administered 2021-03-24: 1000 mL via INTRAVENOUS

## 2021-03-24 MED ORDER — IOHEXOL 300 MG/ML  SOLN
100.0000 mL | Freq: Once | INTRAMUSCULAR | Status: AC | PRN
  Administered 2021-03-24: 100 mL via INTRAVENOUS

## 2021-03-24 MED ORDER — FENTANYL CITRATE PF 50 MCG/ML IJ SOSY
50.0000 ug | PREFILLED_SYRINGE | Freq: Once | INTRAMUSCULAR | Status: AC
  Administered 2021-03-24: 50 ug via INTRAVENOUS
  Filled 2021-03-24: qty 1

## 2021-03-24 MED ORDER — ONDANSETRON HCL 4 MG/2ML IJ SOLN
4.0000 mg | Freq: Once | INTRAMUSCULAR | Status: AC
  Administered 2021-03-24: 4 mg via INTRAVENOUS
  Filled 2021-03-24: qty 2

## 2021-03-24 NOTE — ED Notes (Signed)
Patient transported to CT 

## 2021-03-24 NOTE — ED Provider Notes (Signed)
? ?[place]  ?Provider Note ? ?CSN: [id number] ?Arrival date & time: 03/23/21 2358 ? ?History ?Chief Complaint  ?Patient presents with  ? Abdominal Pain  ? ? ?Cynthia [name] is a 41 y.o. female with prior cholecystectomy reports she had sudden onset of mid back pain, radiating around to epigastric region bilaterally, associated with nausea and vomiting. Symptoms improved then returned. She has had a cold lately but otherwise feeling well. She denies any recent fevers, no diarrhea or constipation. No urinary symptoms. No prior similar symptoms. No recent EtOH use.   ? ? ?Home Medications ?Prior to Admission medications   ?Medication Sig Start Date End Date Taking? Authorizing Provider  ?oxyCODONE (ROXICODONE) 5 MG immediate release tablet Take 1 tablet (5 mg total) by mouth every 6 (six) hours as needed for severe pain. 03/24/21  Yes Truddie Hidden, MD  ?methocarbamol (ROBAXIN) 500 MG tablet Take 1 tablet (500 mg total) by mouth every 8 (eight) hours as needed for muscle spasms. 06/15/19   Virgel Manifold, MD  ? ? ? ?Allergies    ?Patient has no known allergies. ? ? ?Review of Systems   ?Review of Systems ?Please see HPI for pertinent positives and negatives ? ?Physical Exam ?BP 123/72   Pulse 61   Temp 97.8 ?F (36.6 ?C) (Oral)   Resp 17   Ht '5\' 2"'$  (1.575 m)   Wt 68 kg   LMP 10/19/2018   SpO2 100%   BMI 27.44 kg/m?  ? ?Physical Exam ?Vitals and nursing note reviewed.  ?Constitutional:   ?   Appearance: Normal appearance.  ?HENT:  ?   Head: Normocephalic and atraumatic.  ?   Nose: Nose normal.  ?   Mouth/Throat:  ?   Mouth: Mucous membranes are moist.  ?Eyes:  ?   Extraocular Movements: Extraocular movements intact.  ?   Conjunctiva/sclera: Conjunctivae normal.  ?Cardiovascular:  ?   Rate and Rhythm: Normal rate.  ?Pulmonary:  ?   Effort: Pulmonary effort is normal.  ?   Breath sounds: Normal breath sounds.  ?Abdominal:  ?   General: Abdomen is flat.  ?   Palpations: Abdomen is soft.  ?    Tenderness: There is abdominal tenderness in the epigastric area. There is no guarding or rebound. Negative signs include Murphy's sign and McBurney's sign.  ?Musculoskeletal:     ?   General: No swelling. Normal range of motion.  ?   Cervical back: Neck supple.  ?Skin: ?   General: Skin is warm and dry.  ?Neurological:  ?   General: No focal deficit present.  ?   Mental Status: She is alert.  ?Psychiatric:     ?   Mood and Affect: Mood normal.  ? ? ?ED Results / Procedures / Treatments   ?EKG ?None ? ?Procedures ?Procedures ? ?Medications Ordered in the ED ?Medications  ?ondansetron (ZOFRAN) injection 4 mg (4 mg Intravenous Given 03/24/21 0035)  ?fentaNYL (SUBLIMAZE) injection 50 mcg (50 mcg Intravenous Given 03/24/21 0036)  ?lactated ringers bolus 1,000 mL (1,000 mLs Intravenous New Bag/Given 03/24/21 0133)  ?iohexol (OMNIPAQUE) 300 MG/ML solution 100 mL (100 mLs Intravenous Contrast Given 03/24/21 0146)  ? ? ?Initial Impression and Plan ? Patient here with back and epigastric pain. Has features of colicky pain such as with kidney stones, but unusual to be upper abdomen or bilateral. Could also be pancreatitis, but she is not particularly tender one exam. Will check labs, give pain/nausea meds and reassess for further  evaluation with imaging if indicated.  ? ?ED Course  ? ?Clinical Course as of 03/24/21 0244  ?Mon Mar 24, 2021  ?0033 UA with mod Hgb, no signs of infection.  [CS]  ?0101 CBC is normal.  [CS]  ?0119 Lipase is normal. Mildly elevated AST/ALT compared to previous. Will send for CT to evaluate liver.  [CS]  ?0123 Patient reports she is feeling better. Reiterated she has not had any alcohol recently. She has been taking some OTC meds for a cold and so this may explain her LFTs.  [CS]  ?0238 I personally viewed the images from radiology studies and agree with radiologist interpretation: CT with dilated biliary tree. No signs of infection today with normal WBC, no fever, and benign abdomen. She is feeling  much better at the time of re-evaluation. I discussed the findings on CT, including the anatomy and function of her biliary system. I discussed admission vs close outpatient follow up and given her improvement she would like to go home. She will be given a referral to GI. Rx for pain medication. Advised to return to the ED for intractable pain, fever, vomiting or any other concerns. Advised to avoid alcohol or APAP containing products in the meantime.  ? [CS]  ?  ?Clinical Course User Index ?[CS] Truddie Hidden, MD  ? ? ? ?MDM Rules/Calculators/A&P ?Medical Decision Making ?Problems Addressed: ?Abdominal pain, unspecified abdominal location: complicated acute illness or injury ?Common bile duct dilatation: acute illness or injury ?Elevated liver enzymes: acute illness or injury ? ?Amount and/or Complexity of Data Reviewed ?External Data Reviewed: labs. ?   Details: Prior LFTs ?Labs: ordered. Decision-making details documented in ED Course. ?Radiology: ordered and independent interpretation performed. Decision-making details documented in ED Course. ? ?Risk ?Prescription drug management. ?Parenteral controlled substances. ?Decision regarding hospitalization. ? ? ? ?Final Clinical Impression(s) / ED Diagnoses ?Final diagnoses:  ?Abdominal pain, unspecified abdominal location  ?Common bile duct dilatation  ?Elevated liver enzymes  ? ? ?Rx / DC Orders ?ED Discharge Orders   ? ?      Ordered  ?  oxyCODONE (ROXICODONE) 5 MG immediate release tablet  Every 6 hours PRN       ? 03/24/21 0243  ?  Ambulatory referral to Gastroenterology       ? 03/24/21 0243  ? ?  ?  ? ?  ? ?  ?Truddie Hidden, MD ?03/24/21 [phone] ? ?

## 2021-03-24 NOTE — ED Notes (Signed)
Patient back from CT.

## 2021-03-24 NOTE — ED Triage Notes (Signed)
Pt arrived from home with sharp pain that started in her lower back and migrated towards her front lower abdomen. Rates pain 9/10 during triage.  ?

## 2021-03-27 ENCOUNTER — Encounter (INDEPENDENT_AMBULATORY_CARE_PROVIDER_SITE_OTHER): Payer: Self-pay | Admitting: *Deleted

## 2021-03-27 ENCOUNTER — Ambulatory Visit (INDEPENDENT_AMBULATORY_CARE_PROVIDER_SITE_OTHER): Payer: Self-pay | Admitting: Gastroenterology

## 2021-03-27 ENCOUNTER — Encounter (INDEPENDENT_AMBULATORY_CARE_PROVIDER_SITE_OTHER): Payer: Self-pay | Admitting: Gastroenterology

## 2021-03-27 ENCOUNTER — Other Ambulatory Visit: Payer: Self-pay

## 2021-03-27 VITALS — BP 129/87 | HR 74 | Temp 98.6°F | Ht 62.0 in | Wt 147.8 lb

## 2021-03-27 DIAGNOSIS — K838 Other specified diseases of biliary tract: Secondary | ICD-10-CM

## 2021-03-27 DIAGNOSIS — R1013 Epigastric pain: Secondary | ICD-10-CM

## 2021-03-27 DIAGNOSIS — R7401 Elevation of levels of liver transaminase levels: Secondary | ICD-10-CM

## 2021-03-27 NOTE — Patient Instructions (Signed)
We will get you scheduled for MRI for further evaluation of CT findings ?Please use pain medication as needed, if pain does not subside with use of this or you develop fever, chills, vomiting, these are signs that you need to proceed to the ER ?I will be in touch once MRI results are back. I there are not abnormalities on this, we will discuss further evaluation of your elevated liver enzymes ?

## 2021-03-27 NOTE — Progress Notes (Signed)
? ?Referring Provider: [facility] ?Primary Care Physician:  [facility] ?Primary GI Physician: new ? ?Chief Complaint  ?Patient presents with  ? Abdominal Pain  ?  Follow up ED visit on 3/20 for abdominal pain. Pain is in back and comes around to both sides.pain is better. this morning felt some nausea.   ? 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 ? ?Patient presenting today as a new patient for follow up on ED visit for mid back and epigastric pain with nausea and vomiting. ? ?Patient reports that she ate dinner sunday night, a little while later she began having severe mid to lower back pain that radiated around both sides of her abdomen to her epigastric region. She then began having cold sweats with nausea and vomiting. She proceeded to the ER for further evaluation where she had CT scan and labs done. CT A/P with contrast done in ED on 3/20 with Progressive intra and extrahepatic biliary ductal dilation. could reflect the sequela of a recurrent pyogenic cholangitis or an obstructing process at the papilla as can be seen with ampullary stenosis. Lipase was normal, AST 53, ALT 95, T bili and alk phos WNL, WBC 6.8  she states that prior to this occurring, she felt fine. She does report that she ate some [employer] chicken prior to the episode. Has had very little recurrence of pain since then. Had some diarrhea the day after she had the episode but this has resolved. Denies rectal bleeding or melena. She denies any changes in her appetite or weight loss. She had gallbladder removed many years ago and does report presence of gallstones at that time. Does not have any issues with acid reflux. Notably had elevated LFTs in 2010 as well, though she cannot pinpoint ever being told this in the past (AST 246, ALT 95). LFTs appear to have been normal over the past few years. She reports she drinks alcohol very occasionally.  ? ?Family hx: no crc or liver  disease ?Social Hx: etoh on occasion, no tobacco  ?NSAID use: occasionally uses ibuprofen ? ?Last Colonoscopy:never ?Last Endoscopy:never ? ?Past Medical History:  ?Diagnosis Date  ? History of ITP   ? ? ?Past Surgical History:  ?Procedure Laterality Date  ? CHOLECYSTECTOMY    ? GALLBLADDER SURGERY    ? SUPRACERVICAL ABDOMINAL HYSTERECTOMY N/A 12/08/2018  ? Procedure: HYSTERECTOMY SUPRACERVICAL ABDOMINAL;  Surgeon: Florian Buff, MD;  Location: [facility];  Service: Gynecology;  Laterality: N/A;  ? TUBAL LIGATION    ? ? ?Current Outpatient Medications  ?Medication Sig Dispense Refill  ? oxyCODONE (ROXICODONE) 5 MG immediate release tablet Take 1 tablet (5 mg total) by mouth every 6 (six) hours as needed for severe pain. 10 tablet 0  ? methocarbamol (ROBAXIN) 500 MG tablet Take 1 tablet (500 mg total) by mouth every 8 (eight) hours as needed for muscle spasms. (Patient not taking: Reported on 03/27/2021) 20 tablet 0  ? ?No current facility-administered medications for this visit.  ? ? ?Allergies as of 03/27/2021  ? (No Known Allergies)  ? ? ?Family History  ?Problem Relation Age of Onset  ? Aneurysm Maternal Grandmother   ? Diabetes Father   ? Breast cancer Mother   ? Hypertension Sister   ? ? ?Social History  ? ?Socioeconomic History  ? Marital status: Married  ?  Spouse name: Not on file  ? Number of children: 3  ? Years of  education: Not on file  ? Highest education level: Not on file  ?Occupational History  ? Not on file  ?Tobacco Use  ? Smoking status: Never  ?  Passive exposure: Current  ? Smokeless tobacco: Never  ?Vaping Use  ? Vaping Use: Never used  ?Substance and Sexual Activity  ? Alcohol use: Not Currently  ?  Comment: "every now and then"   ? Drug use: Not Currently  ?  Types: Marijuana  ?  Comment: Last used July 2020.  ? Sexual activity: Not Currently  ?  Birth control/protection: Surgical  ?  Comment: supracervical hyst  ?Other Topics Concern  ? Not on file  ?Social History Narrative  ? Not on file   ? ?Social Determinants of Health  ? ?Financial Resource Strain: Not on file  ?Food Insecurity: Not on file  ?Transportation Needs: Not on file  ?Physical Activity: Not on file  ?Stress: Not on file  ?Social Connections: Not on file  ? ?Review of systems ?General: negative for malaise, night sweats, fever, chills, weight loss ?Neck: Negative for lumps, goiter, pain and significant neck swelling ?Resp: Negative for cough, wheezing, dyspnea at rest ?CV: Negative for chest pain, leg swelling, palpitations, orthopnea ?GI: denies melena, hematochezia, diarrhea, constipation, dysphagia, odyonophagia, early satiety or unintentional weight loss. +previous lower back and epigastric pain +previous nausea and vomiting ?MSK: Negative for joint pain or swelling, back pain, and muscle pain. ?Derm: Negative for itching or rash ?Psych: Denies depression, anxiety, memory loss, confusion. No homicidal or suicidal ideation.  ?Heme: Negative for prolonged bleeding, bruising easily, and swollen nodes. ?Endocrine: Negative for cold or heat intolerance, polyuria, polydipsia and goiter. ?Neuro: negative for tremor, gait imbalance, syncope and seizures. ?The remainder of the review of systems is noncontributory. ? ?Physical Exam: ?BP 129/87 (BP Location: Left Arm, Patient Position: Sitting, Cuff Size: Normal)   Pulse 74   Temp 98.6 ?F (37 ?C) (Oral)   Ht 5' 2" (1.575 m)   Wt 147 lb 12.8 oz (67 kg)   LMP 10/19/2018   BMI 27.03 kg/m?  ?General:   Alert and oriented. No distress noted. Pleasant and cooperative.  ?Head:  Normocephalic and atraumatic. ?Eyes:  Conjuctiva clear without scleral icterus. ?Mouth:  Oral mucosa pink and moist. Good dentition. No lesions. ?Heart: Normal rate and rhythm, s1 and s2 heart sounds present.  ?Lungs: Clear lung sounds in all lobes. Respirations equal and unlabored. ?Abdomen:  +BS, soft, and non-distended. Mild TTP of epigastric region. No rebound or guarding. No HSM or masses noted. ?Derm: No palmar  erythema or jaundice ?Msk:  Symmetrical without gross deformities. Normal posture. ?Extremities:  Without edema. ?Neurologic:  Alert and  oriented x4 ?Psych:  Alert and cooperative. Normal mood and affect. ? ?Invalid input(s): 6 MONTHS  ? ?ASSESSMENT: ?Taquanna M Boxx is a 40 y.o. female presenting today as a new patient for ED follow up of epigastric/back pain, nausea and abnormal findings on CT imaging.  ? ?Sudden onset of lower back and epigastric pain with nausea and vomiting that prompted ED visit on 3/20. Reports no real recurrence of pain since then. CT at that time with Progressive intra and extrahepatic biliary ductal dilation. Had transaminitis at that time as well without hyperbilirubinemia or elevation of alk phos. Notably had similar value transaminitis in the past in 2010, however, patient does not recall any specific events around that time. Cholecystectomy was in 2008 and she did have gallstones at that time. Will proceed with MRCP for further evaluation,   however, if this is unremarkable, will require further investigation of elevated transaminases. She denies fevers, chills, nausea or vomiting currently. She will make me aware if she develops recurrence of pain or any new symptoms.  ? ? ?PLAN:  ?MRCP w/wo (does not require pre meds for claustrophobia) ?2.  Proceed with liver serologies, liver US if MRCP unremarkable ?3. Patient to make me aware of new or worsening symptoms ?4. Further recommendations to follow after results of MRCP reviewed ? ?All questions were answered, patient verbalized understanding and is in agreement with plan as outlined above.  ? ?Follow Up: ?TBD ? ?Chelsea L. Carlan, MSN, APRN, AGNP-C ?Adult-Gerontology Nurse Practitioner ?[facility] ? ?

## 2021-03-30 DIAGNOSIS — R1013 Epigastric pain: Secondary | ICD-10-CM | POA: Insufficient documentation

## 2021-03-30 DIAGNOSIS — R7401 Elevation of levels of liver transaminase levels: Secondary | ICD-10-CM | POA: Insufficient documentation

## 2021-03-30 DIAGNOSIS — K838 Other specified diseases of biliary tract: Secondary | ICD-10-CM | POA: Insufficient documentation

## 2021-04-08 ENCOUNTER — Ambulatory Visit (HOSPITAL_COMMUNITY): Payer: Self-pay

## 2021-04-10 ENCOUNTER — Ambulatory Visit (HOSPITAL_COMMUNITY)
Admission: RE | Admit: 2021-04-10 | Discharge: 2021-04-10 | Disposition: A | Discharge status: Home / Self Care | Payer: Self-pay | Source: Ambulatory Visit | Attending: Gastroenterology | Admitting: Gastroenterology

## 2021-04-10 ENCOUNTER — Other Ambulatory Visit (INDEPENDENT_AMBULATORY_CARE_PROVIDER_SITE_OTHER): Payer: Self-pay | Admitting: Gastroenterology

## 2021-04-10 DIAGNOSIS — R1013 Epigastric pain: Secondary | ICD-10-CM | POA: Insufficient documentation

## 2021-04-10 DIAGNOSIS — K838 Other specified diseases of biliary tract: Secondary | ICD-10-CM

## 2021-04-10 DIAGNOSIS — R7401 Elevation of levels of liver transaminase levels: Secondary | ICD-10-CM | POA: Insufficient documentation

## 2021-04-10 MED ORDER — GADOBUTROL 1 MMOL/ML IV SOLN
7.0000 mL | Freq: Once | INTRAVENOUS | Status: AC | PRN
  Administered 2021-04-10: 7 mL via INTRAVENOUS

## 2021-04-17 ENCOUNTER — Telehealth (INDEPENDENT_AMBULATORY_CARE_PROVIDER_SITE_OTHER): Payer: Self-pay | Admitting: Gastroenterology

## 2021-04-17 NOTE — Telephone Encounter (Signed)
I reviewed the imaging performed recently for evaluation of dilated bile ducts.  This was reviewed with Dr. Lavonia Dana.  Was presence of marked intrahepatic ductal dilation in the left lobe with presence of an area of possible debris which raises concern for possible cholangiocarcinoma given progression compared to previous imaging.  I also discussed the case with Dr. Laural Golden who agreed that the patient should undergo urgent evaluation with an endoscopic retrograde cholangiopancreatography, ideally at a quaternary center such as [facility]. ? ?I called the patient to discuss this but she did not pick up the phone.  I left a detailed voice message. ? ?Hi Ann, can you please refer the patient to [facility] gastroenterology for urgent evaluation? 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 ?Thanks, ? ?Maylon Peppers, MD ?Gastroenterology and Hepatology ?[facility] ? ?

## 2021-04-18 NOTE — Telephone Encounter (Signed)
Thanks

## 2021-04-21 NOTE — Telephone Encounter (Signed)
Thanks

## 2021-06-03 NOTE — Telephone Encounter (Signed)
Cynthia [name] please lets check this ASAP, if there is a lot of wait time please reach [facility] for help instead

## 2021-06-03 NOTE — Telephone Encounter (Signed)
Thanks

## 2021-07-10 ENCOUNTER — Telehealth (INDEPENDENT_AMBULATORY_CARE_PROVIDER_SITE_OTHER): Payer: Self-pay | Admitting: *Deleted

## 2021-07-10 NOTE — Telephone Encounter (Signed)
Spoke to [place] at [facility], they have made several attempts to contact patient with no return calls, they have cancelled referral

## 2021-11-10 ENCOUNTER — Other Ambulatory Visit: Payer: Self-pay

## 2021-11-10 ENCOUNTER — Emergency Department (HOSPITAL_COMMUNITY)
Admission: EM | Admit: 2021-11-10 | Discharge: 2021-11-10 | Disposition: A | Discharge status: Home / Self Care | Payer: Self-pay | Attending: Student | Admitting: Student

## 2021-11-10 ENCOUNTER — Encounter (HOSPITAL_COMMUNITY): Payer: Self-pay | Admitting: *Deleted

## 2021-11-10 DIAGNOSIS — M545 Low back pain, unspecified: Secondary | ICD-10-CM

## 2021-11-10 DIAGNOSIS — M549 Dorsalgia, unspecified: Secondary | ICD-10-CM | POA: Insufficient documentation

## 2021-11-10 DIAGNOSIS — Z7722 Contact with and (suspected) exposure to environmental tobacco smoke (acute) (chronic): Secondary | ICD-10-CM | POA: Insufficient documentation

## 2021-11-10 LAB — URINALYSIS, ROUTINE W REFLEX MICROSCOPIC
Bacteria, UA: NONE SEEN
Bilirubin Urine: NEGATIVE
Glucose, UA: NEGATIVE mg/dL
Ketones, ur: NEGATIVE mg/dL
Nitrite: NEGATIVE
Protein, ur: NEGATIVE mg/dL
Specific Gravity, Urine: 1.017 (ref 1.005–1.030)
pH: 6 (ref 5.0–8.0)

## 2021-11-10 LAB — PREGNANCY, URINE: Preg Test, Ur: NEGATIVE

## 2021-11-10 MED ORDER — LIDOCAINE 5 % EX PTCH
2.0000 | MEDICATED_PATCH | CUTANEOUS | Status: DC
  Administered 2021-11-10: 2 via TRANSDERMAL
  Filled 2021-11-10: qty 2

## 2021-11-10 MED ORDER — NAPROXEN 375 MG PO TABS: 375.0000 mg | ORAL_TABLET | Freq: Two times a day (BID) | ORAL | 0 refills | Status: DC

## 2021-11-10 MED ORDER — LIDOCAINE 5 % EX PTCH: 1.0000 | MEDICATED_PATCH | CUTANEOUS | 0 refills | Status: DC

## 2021-11-10 MED ORDER — KETOROLAC TROMETHAMINE 15 MG/ML IJ SOLN
15.0000 mg | Freq: Once | INTRAMUSCULAR | Status: AC
  Administered 2021-11-10: 15 mg via INTRAMUSCULAR
  Filled 2021-11-10: qty 1

## 2021-11-10 NOTE — ED Triage Notes (Signed)
Pt c/o bilateral back pain that started x 2 days ago; pt denies any obvious injury or any urinary sx

## 2021-11-10 NOTE — ED Notes (Addendum)
Pain assessed and found to be lower thoracic/upper lumbar, bilaterality, para spinous. Area is tender. No swelling or bruising noted. Mild pain and tenderness noted towards both flank areas.

## 2021-11-10 NOTE — ED Provider Notes (Signed)
[facility] Provider Note  CSN: [id number] Arrival date & time: 11/10/21 0177  Chief Complaint(s) Back Pain  HPI Cynthia [name] is a 41 y.o. female who presents emergency department for evaluation of back pain.  She states that pain acutely worsened this morning and she is having trouble getting comfortable.  Pain is worse in bilateral paraspinal muscles, worse with twisting and movement.  Denies saddle anesthesia, numbness, tingling, weakness, urinary incontinence or fecal incontinence or any other red flag signs of back pain.  Denies dysuria, increased frequency, chest pain, shortness of breath, abdominal pain or any other systemic symptoms.   Past Medical History Past Medical History:  Diagnosis Date   History of ITP    Patient Active Problem List   Diagnosis Date Noted   Abdominal pain, epigastric 03/30/2021   Intrahepatic bile duct dilation 03/30/2021   Transaminitis 03/30/2021   S/P hysterectomy 11/09/2018   Iron deficiency anemia due to chronic blood loss 11/09/2018   Home Medication(s) Prior to Admission medications   Medication Sig Start Date End Date Taking? Authorizing Provider  methocarbamol (ROBAXIN) 500 MG tablet Take 1 tablet (500 mg total) by mouth every 8 (eight) hours as needed for muscle spasms. Patient not taking: Reported on 03/27/2021 06/15/19   Virgel Manifold, MD  oxyCODONE (ROXICODONE) 5 MG immediate release tablet Take 1 tablet (5 mg total) by mouth every 6 (six) hours as needed for severe pain. 03/24/21   Truddie Hidden, MD                                                                                                                                    Past Surgical History Past Surgical History:  Procedure Laterality Date   CHOLECYSTECTOMY     GALLBLADDER SURGERY     SUPRACERVICAL ABDOMINAL HYSTERECTOMY N/A 12/08/2018   Procedure: HYSTERECTOMY SUPRACERVICAL ABDOMINAL;  Surgeon: Florian Buff, MD;  Location: [facility];  Service:  Gynecology;  Laterality: N/A;   TUBAL LIGATION     Family History Family History  Problem Relation Age of Onset   Aneurysm Maternal Grandmother    Diabetes Father    Breast cancer Mother    Hypertension Sister     Social History Social History   Tobacco Use   Smoking status: Never    Passive exposure: Current   Smokeless tobacco: Never  Vaping Use   Vaping Use: Never used  Substance Use Topics   Alcohol use: Not Currently    Comment: "every now and then"    Drug use: Not Currently    Types: Marijuana    Comment: Last used July 2020.   Allergies Patient has no known allergies.  Review of Systems Review of Systems  Musculoskeletal:  Positive for back pain.    Physical Exam Vital Signs  I have reviewed the triage vital signs BP (!) 140/88 (BP Location: Left Arm)   Pulse 75   Temp  98.3 F (36.8 C) (Oral)   Resp 16   Ht '5\' 2"'$  (1.575 m)   Wt 72.6 kg   LMP 10/19/2018   SpO2 100%   BMI 29.26 kg/m   Physical Exam Vitals and nursing note reviewed.  Constitutional:      General: She is not in acute distress.    Appearance: She is well-developed.  HENT:     Head: Normocephalic and atraumatic.  Eyes:     Conjunctiva/sclera: Conjunctivae normal.  Cardiovascular:     Rate and Rhythm: Normal rate and regular rhythm.     Heart sounds: No murmur heard. Pulmonary:     Effort: Pulmonary effort is normal. No respiratory distress.     Breath sounds: Normal breath sounds.  Abdominal:     Palpations: Abdomen is soft.     Tenderness: There is no abdominal tenderness.  Musculoskeletal:        General: Tenderness present. No swelling.     Cervical back: Neck supple.  Skin:    General: Skin is warm and dry.     Capillary Refill: Capillary refill takes less than 2 seconds.  Neurological:     Mental Status: She is alert.  Psychiatric:        Mood and Affect: Mood normal.     ED Results and Treatments Labs (all labs ordered are listed, but only abnormal results  are displayed) Labs Reviewed  URINALYSIS, [place]                                                                                                                          Radiology No results found.  Pertinent labs & imaging results that were available during my care of the patient were reviewed by me and considered in my medical decision making (see MDM for details).  Medications Ordered in ED Medications  ketorolac (TORADOL) 15 MG/ML injection 15 mg (has no administration in time range)  lidocaine (LIDODERM) 5 % 2 patch (has no administration in time range)                                                                                                                                     Procedures Procedures  (including critical care time)  Medical Decision Making / ED Course   This patient presents to the ED for concern of back pain, this involves an extensive number  of treatment options, and is a complaint that carries with it a high risk of complications and morbidity.  The differential diagnosis includes musculoskeletal strain, fracture, nephrolithiasis, UTI, pyelonephritis, sciatica  MDM: The emergency room for evaluation of back pain.  Physical exam with point tenderness to the paraspinal muscles of the lumbar spine with no midline L-spine tenderness.  Neurologic exam unremarkable.  Urinalysis and pregnancy test negative.  Patient given Toradol and lidocaine patches and on reevaluation her pain has improved.  Patient presentation consistent with musculoskeletal back pain and she was discharged with Naprosyn, lidocaine patches and outpatient follow-up   Additional history obtained: -Additional history obtained from husband -External records from outside source obtained and reviewed including: Chart review including previous notes, labs, imaging, consultation notes   Lab Tests: -I ordered, reviewed, and interpreted labs.   The pertinent results include:    Labs Reviewed  URINALYSIS, ROUTINE W REFLEX MICROSCOPIC     Medicines ordered and prescription drug management: Meds ordered this encounter  Medications   ketorolac (TORADOL) 15 MG/ML injection 15 mg   lidocaine (LIDODERM) 5 % 2 patch    -I have reviewed the patients home medicines and have made adjustments as needed  Critical interventions none   Cardiac Monitoring: The patient was maintained on a cardiac monitor.  I personally viewed and interpreted the cardiac monitored which showed an underlying rhythm of: NSR  Social Determinants of Health:  Factors impacting patients care include: none   Reevaluation: After the interventions noted above, I reevaluated the patient and found that they have :improved  Co morbidities that complicate the patient evaluation  Past Medical History:  Diagnosis Date   History of ITP       Dispostion: I considered admission for this patient, but she does not meet inpatient criteria for admission and is safe for discharge with outpatient follow-up     Final Clinical Impression(s) / ED Diagnoses Final diagnoses:  None     '@PCDICTATION'$ @    Teressa Lower, MD 11/10/21 [phone]

## 2023-07-16 IMAGING — MR MR ABDOMEN WO/W CM MRCP
21 of 23 series · 45 of 48 positions shown · IV contrast (gadavist)
Comparison: CT scan abdomen/pelvis 03/24/2021

CLINICAL DATA: Elevated liver enzymes with biliary dilatation.

EXAM:
MRI ABDOMEN WITHOUT AND WITH CONTRAST (INCLUDING MRCP)
TECHNIQUE: Multiplanar multisequence MR imaging of the abdomen was performed
both before and after the administration of intravenous contrast.
Heavily T2-weighted images of the biliary and pancreatic ducts were
obtained, and three-dimensional MRCP images were rendered by post
processing.
CONTRAST:  7mL GADAVIST GADOBUTROL 1 MMOL/ML IV SOLN

[Series 3: cor haste · coronal · 6.0mm · 1.25mm/px · 1 of 26 slices shown]
[im 1/26]
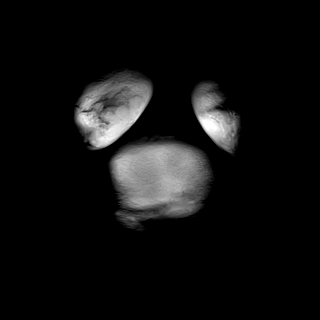

[Series 4: T2 fat-sat · axial · 6.0mm · 1.19mm/px · 1 of 34 slices shown]
[im 1/34]
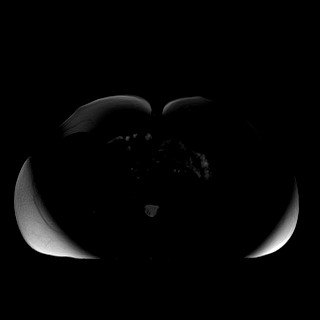

[Series 5: DWI · axial · 6.0mm · 1.42mm/px · 1 of 33 slices shown (1 of 4)]
[im 1/33]
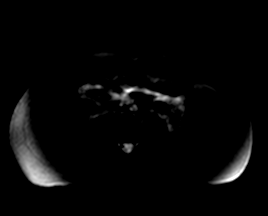

[Series 5: DWI · axial · 6.0mm · 1.42mm/px · 1 of 33 slices shown (2 of 4)]
[im 1/33]
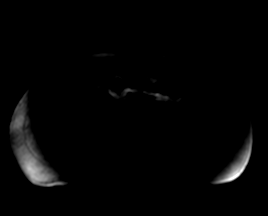

[Series 5: DWI · axial · 6.0mm · 1.42mm/px · 1 of 33 slices shown (3 of 4)]
[im 1/33]
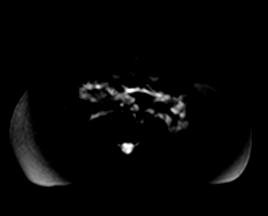

[Series 6: DWI · axial · 6.0mm · 1.42mm/px · 1 of 33 slices shown (4 of 4)]
[im 1/33]
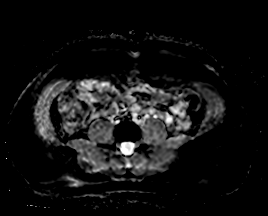

[Series 7: ax in and · axial · 3.0mm · 1.25mm/px · z∈[-91,+146]mm · 3 of 80 slices shown (1 of 2)]
[im 1/80]
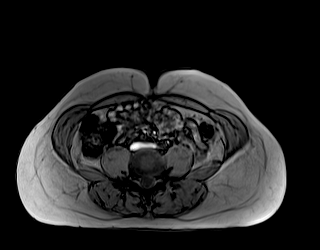
[im 40/80]
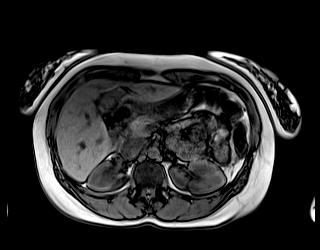
[im 80/80]
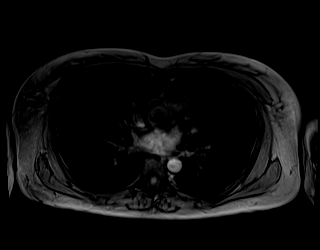

[Series 8: ax in and · axial · 3.0mm · 1.25mm/px · z∈[-91,+146]mm · 3 of 80 slices shown (2 of 2)]
[im 1/80]
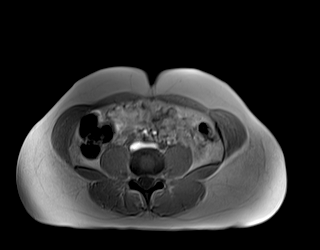
[im 40/80]
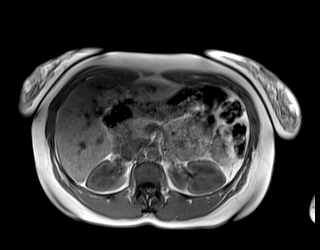
[im 80/80]
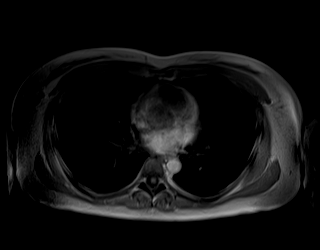

[Series 9: ax haste bh · axial · 6.0mm · 1.19mm/px · 1 of 30 slices shown]
[im 1/30]
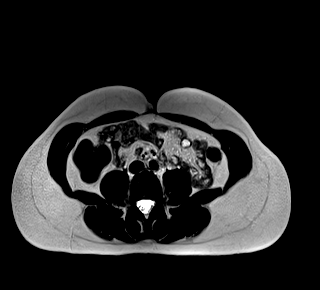

[Series 13: MRCP · coronal · 50.0mm · 0.78mm/px · 1 of 5 slices shown (1 of 2)]
[im 1/5]
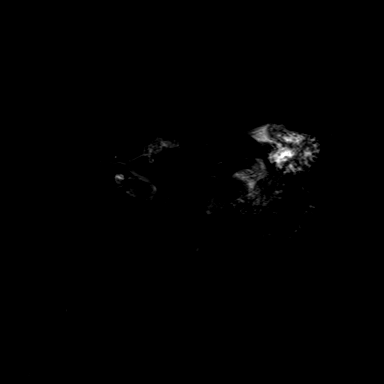

[Series 14: MRCP · coronal · 4.0mm · 1.12mm/px · 1 of 15 slices shown (2 of 2)]
[im 1/15]
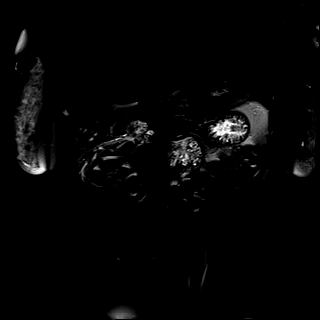

[Series 15: T1 dynamic · axial · 3.0mm · 1.19mm/px · z∈[-91,+146]mm · 3 of 80 slices shown (1 of 7)]
[im 1/80]
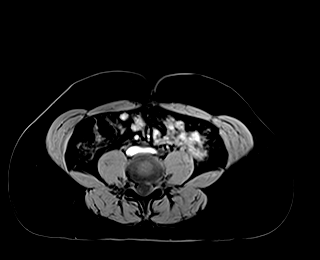
[im 40/80]
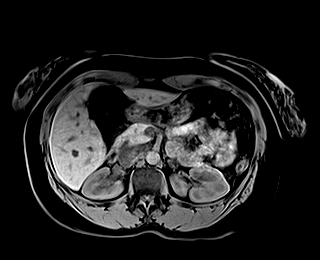
[im 80/80]
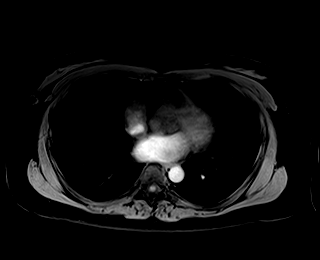

[Series 17: T1 dynamic · axial · 3.0mm · 1.19mm/px · z∈[-91,+146]mm · 3 of 80 slices shown (2 of 7)]
[im 1/80]
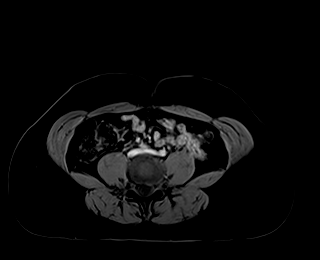
[im 40/80]
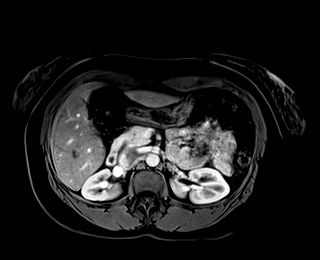
[im 80/80]
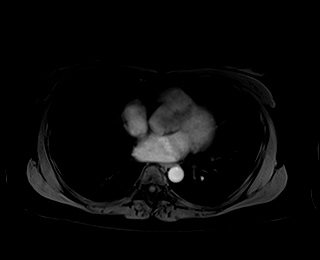

[Series 18: T1 dynamic · axial · 3.0mm · 1.19mm/px · z∈[-91,+146]mm · 3 of 80 slices shown (3 of 7)]
[im 1/80]
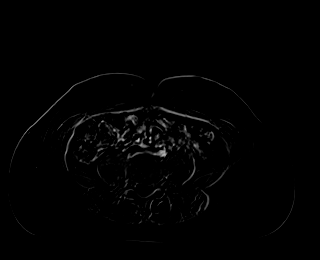
[im 40/80]
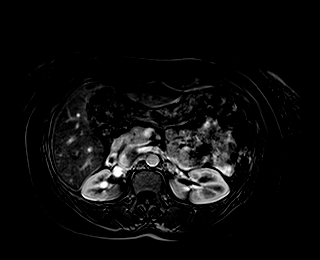
[im 80/80]
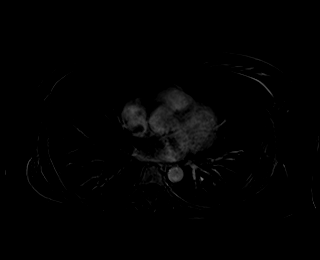

[Series 19: T1 dynamic · axial · 3.0mm · 1.19mm/px · z∈[-91,+146]mm · 3 of 80 slices shown (4 of 7)]
[im 1/80]
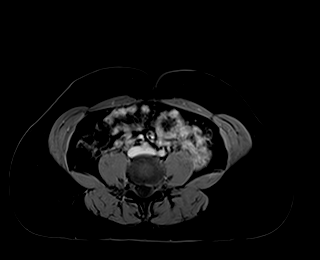
[im 40/80]
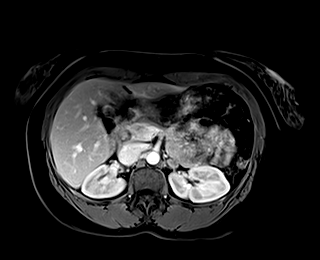
[im 80/80]
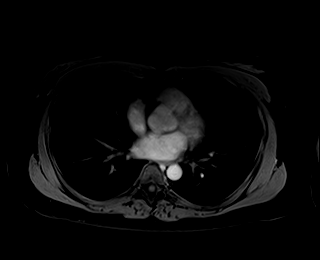

[Series 20: T1 dynamic · axial · 3.0mm · 1.19mm/px · z∈[-91,+146]mm · 3 of 80 slices shown (5 of 7)]
[im 1/80]
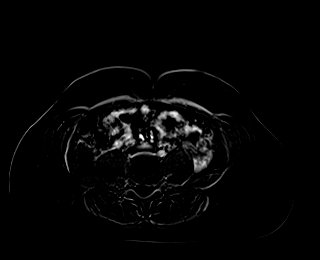
[im 40/80]
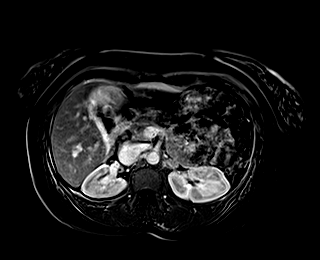
[im 80/80]
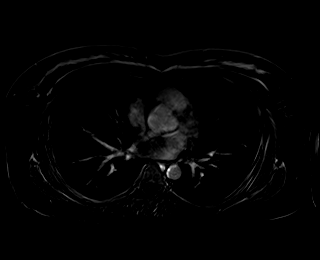

[Series 21: T1 dynamic · axial · 3.0mm · 1.19mm/px · z∈[-91,+146]mm · 3 of 80 slices shown (6 of 7)]
[im 1/80]
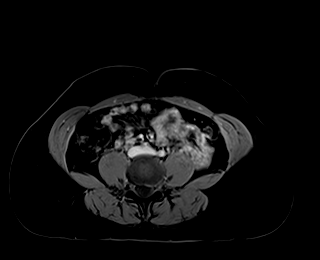
[im 40/80]
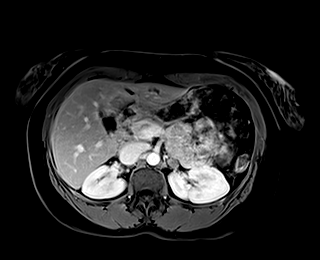
[im 80/80]
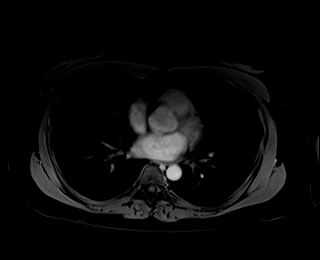

[Series 22: T1 dynamic · axial · 3.0mm · 1.19mm/px · z∈[-91,+146]mm · 3 of 80 slices shown (7 of 7)]
[im 1/80]
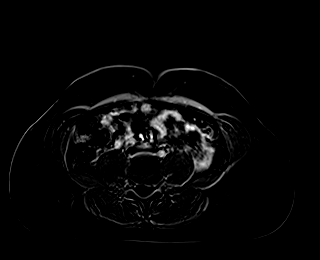
[im 40/80]
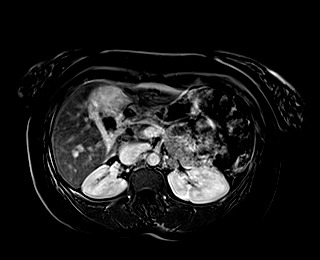
[im 80/80]
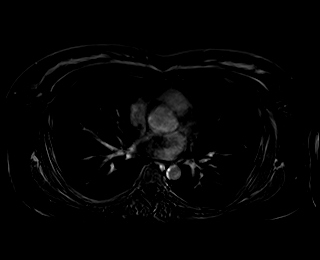

[Series 23: T1 dynamic post-contrast · coronal · 3.0mm · 1.31mm/px · 3 of 72 slices shown (1 of 3)]
[im 1/72]
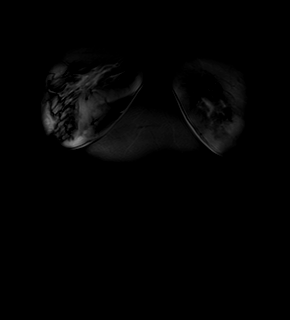
[im 36/72]
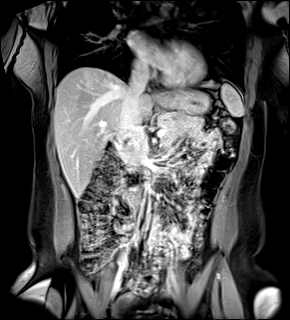
[im 72/72]
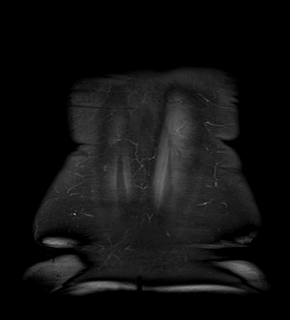

[Series 24: T1 dynamic post-contrast · axial · 3.0mm · 1.19mm/px · z∈[-91,+146]mm · 3 of 80 slices shown (2 of 3)]
[im 1/80]
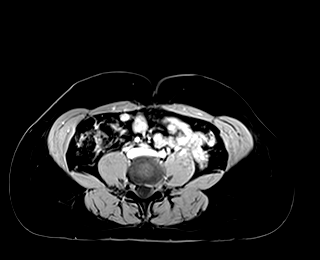
[im 40/80]
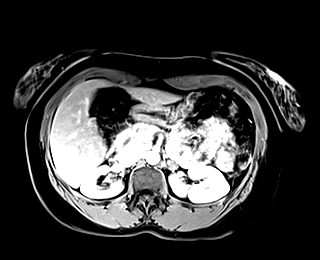
[im 80/80]
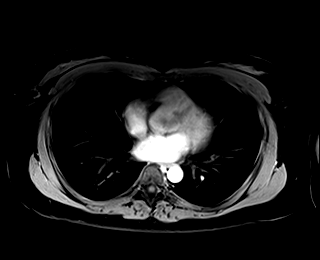

[Series 25: T1 dynamic post-contrast · axial · 3.0mm · 1.19mm/px · z∈[-91,+146]mm · 3 of 80 slices shown (3 of 3)]
[im 1/80]
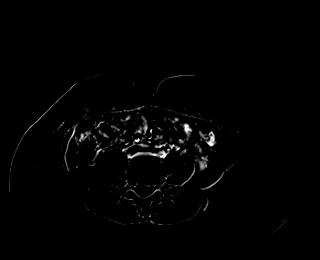
[im 40/80]
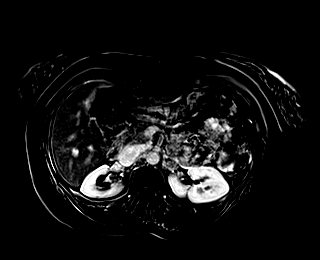
[im 80/80]
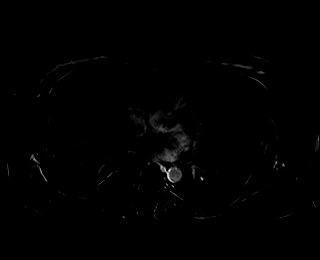

[45 of 48 positions shown; findings below may reference images not displayed]

FINDINGS: Lower chest: Unremarkable.

Hepatobiliary: No hypervascular lesion within the liver parenchyma.
No focal restricted diffusion within the liver. Marked intrahepatic
biliary duct dilatation noted lateral segment left liver, similar to
prior CT. Relative sparing of the right intrahepatic bile ducts
again noted. No earlier late enhancing mass lesion identified at the
level of the abrupt caliber change in the left hepatic ducts that
appear to be diffusely debris-filled. Common duct in the
hepatoduodenal ligament is nondilated at 5 mm diameter. Common bile
duct in the head of the pancreas is nondilated 5 mm diameter.

Pancreas: No focal mass lesion. No dilatation of the main duct. No
intraparenchymal cyst. No peripancreatic edema.

Spleen:  No splenomegaly. No focal mass lesion.

Adrenals/Urinary Tract: No adrenal nodule or mass. Kidneys
unremarkable.

Stomach/Bowel: Stomach is unremarkable. No gastric wall thickening.
No evidence of outlet obstruction. Duodenum is normally positioned
as is the ligament of Treitz. No small bowel or colonic dilatation
within the visualized abdomen.

Vascular/Lymphatic: No abdominal aortic aneurysm. No abdominal
lymphadenopathy

Other:  No intraperitoneal free fluid.

Musculoskeletal: No focal suspicious marrow enhancement within the
visualized bony anatomy.
IMPRESSION: 1. Marked intrahepatic biliary duct dilatation noted lateral segment
left liver, similar to prior CT. Relative sparing of the right
intrahepatic bile ducts again noted. No early or late enhancing mass
lesion identified at the level of the abrupt caliber change in the
left intrahepatic ducts that appear to be diffusely debris-filled.
Common duct and common bile duct distension seen on previous CT has
resolved in the interval. ERCP may be warranted to further evaluate
as nonvisualized obstructing intraductal lesion remains a
consideration.
2. No pancreatic mass lesion. No dilatation of the main pancreatic
duct.

## 2023-10-24 ENCOUNTER — Telehealth: Payer: Self-pay | Admitting: Nurse Practitioner

## 2023-10-24 DIAGNOSIS — B3731 Acute candidiasis of vulva and vagina: Secondary | ICD-10-CM

## 2023-10-24 MED ORDER — FLUCONAZOLE 150 MG PO TABS: 150.0000 mg | ORAL_TABLET | ORAL | 0 refills | Status: AC | PRN

## 2023-10-24 NOTE — Progress Notes (Signed)

## 2023-10-25 ENCOUNTER — Telehealth: Payer: Self-pay

## 2023-10-27 ENCOUNTER — Telehealth: Payer: Self-pay | Admitting: Physician Assistant

## 2023-10-27 DIAGNOSIS — R3989 Other symptoms and signs involving the genitourinary system: Secondary | ICD-10-CM

## 2023-10-27 MED ORDER — CEPHALEXIN 500 MG PO CAPS: 500.0000 mg | ORAL_CAPSULE | Freq: Two times a day (BID) | ORAL | 0 refills | Status: AC

## 2023-10-27 NOTE — Patient Instructions (Signed)
 Creasie CHRISTELLA Fuss, thank you for joining [name], PA-C for today's virtual visit.  While this provider is not your primary care provider (PCP), if your PCP is located in our provider database this encounter information will be shared with them immediately following your visit.   A [facility] MyChart account gives you access to today's visit and all your visits, tests, and labs performed at [facility]  click here if you don't have a [facility] MyChart account or go to [email or web address]  Consent: (Patient) Cynthia [name] provided verbal consent for this virtual visit at the beginning of the encounter.  Current Medications:  Current Outpatient Medications:    fluconazole (DIFLUCAN) 150 MG tablet, Take 1 tablet (150 mg total) by mouth every three (3) days as needed., Disp: 2 tablet, Rfl: 0   lidocaine  (LIDODERM ) 5 %, Place 1 patch onto the skin daily. Remove & Discard patch within 12 hours or as directed by MD, Disp: 30 patch, Rfl: 0   methocarbamol  (ROBAXIN ) 500 MG tablet, Take 1 tablet (500 mg total) by mouth every 8 (eight) hours as needed for muscle spasms. (Patient not taking: Reported on 03/27/2021), Disp: 20 tablet, Rfl: 0   naproxen  (NAPROSYN ) 375 MG tablet, Take 1 tablet (375 mg total) by mouth 2 (two) times daily., Disp: 20 tablet, Rfl: 0   oxyCODONE  (ROXICODONE ) 5 MG immediate release tablet, Take 1 tablet (5 mg total) by mouth every 6 (six) hours as needed for severe pain., Disp: 10 tablet, Rfl: 0   Medications ordered in this encounter:  No orders of the defined types were placed in this encounter.    *If you need refills on other medications prior to your next appointment, please contact your pharmacy*  Follow-Up: Call back or seek an in-person evaluation if the symptoms worsen or if the condition fails to improve as anticipated.   Virtual Care [phone]  Other Instructions Your symptoms are consistent with a bladder infection,  also called acute cystitis. Please take your antibiotic (Keflex) as directed until all pills are gone.  Stay very well hydrated.  Consider a daily probiotic (Align, Culturelle, or Activia) to help prevent stomach upset caused by the antibiotic.  Taking a probiotic daily may also help prevent recurrent UTIs.  Also consider taking AZO (Phenazopyridine) tablets to help decrease pain with urination.  If you note any non-resolving, new, or worsening symptoms despite treatment, please seek an in-person evaluation ASAP.   Urinary Tract Infection A urinary tract infection (UTI) can occur any place along the urinary tract. The tract includes the kidneys, ureters, bladder, and urethra. A type of germ called bacteria often causes a UTI. UTIs are often helped with antibiotic medicine.  HOME CARE  If given, take antibiotics as told by your doctor. Finish them even if you start to feel better. Drink enough fluids to keep your pee (urine) clear or pale yellow. Avoid tea, drinks with caffeine, and bubbly (carbonated) drinks. Pee often. Avoid holding your pee in for a long time. Pee before and after having sex (intercourse). Wipe from front to back after you poop (bowel movement) if you are a woman. Use each tissue only once. GET HELP RIGHT AWAY IF:  You have back pain. You have lower belly (abdominal) pain. You have chills. You feel sick to your stomach (nauseous). You throw up (vomit). Your burning or discomfort with peeing does not go away. You have a fever. Your symptoms are not better in 3 days. MAKE SURE YOU:  Understand these instructions. Will watch your condition. Will get help right away if you are not doing well or get worse. Document Released: 06/10/2007 Document Revised: 09/16/2011 Document Reviewed: 07/23/2011 [facility] Patient Information 2015 [place], [place]. This information is not intended to replace advice given to you by your health care provider. Make sure you discuss any questions you  have with your health care provider.    If you have been instructed to have an in-person evaluation today at a local Urgent Care facility, please use the link below. It will take you to a list of all of our available [facility], including address, phone number and hours of operation. Please do not delay care.  [facility]  If you or a family member do not have a primary care provider, use the link below to schedule a visit and establish care. When you choose a [facility] primary care physician or advanced practice provider, you gain a long-term partner in health. Find a Primary Care Provider  Learn more about [facility]'s in-office and virtual care options: [facility] - Get Care Now

## 2023-10-27 NOTE — Progress Notes (Signed)
 Virtual Visit Consent   Cynthia [name], you are scheduled for a virtual visit with a [facility] provider today. Just as with appointments in the office, your consent must be obtained to participate. Your consent will be active for this visit and any virtual visit you may have with one of our providers in the next 365 days. If you have a MyChart account, a copy of this consent can be sent to you electronically.  As this is a virtual visit, video technology does not allow for your provider to perform a traditional examination. This may limit your provider's ability to fully assess your condition. If your provider identifies any concerns that need to be evaluated in person or the need to arrange testing (such as labs, EKG, etc.), we will make arrangements to do so. Although advances in technology are sophisticated, we cannot ensure that it will always work on either your end or our end. If the connection with a video visit is poor, the visit may have to be switched to a telephone visit. With either a video or telephone visit, we are not always able to ensure that we have a secure connection.  By engaging in this virtual visit, you consent to the provision of healthcare and authorize for your insurance to be billed (if applicable) for the services provided during this visit. Depending on your insurance coverage, you may receive a charge related to this service.  I need to obtain your verbal consent now. Are you willing to proceed with your visit today? Cynthia [name] has provided verbal consent on 10/27/2023 for a virtual visit (video or telephone). Cynthia [name], [place]  Date: 10/27/2023 5:00 PM   Virtual Visit via Video Note   I, Cynthia [name], connected with  Cynthia [name]  ([id number], [birth year]) on 10/27/23 at  5:15 PM EDT by a video-enabled telemedicine application and verified that I am speaking with the correct person using two identifiers.  Location: Patient: Virtual Visit  Location Patient: Home Provider: Virtual Visit Location Provider: Home Office   I discussed the limitations of evaluation and management by telemedicine and the availability of in person appointments. The patient expressed understanding and agreed to proceed.    History of Present Illness: Cynthia [name] is a 43 y.o. who identifies as a female who was assigned female at birth, and is being seen today for concern for possible UTI. Endorses few days of urgency, frequency, dysuria and some hesitancy. Urine with stronger odor to it.  Denies blood in the urine.Denies belly pain. Mild lower back pain -- alleviated with OTC ibuprofen . Recently treated for yeast vaginitis -- notes complete resolution of symptoms. Denies concern for pregnancy.  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  Resting comfortably at home.  Head is normocephalic, atraumatic.  No labored breathing.  Speech is clear and coherent with logical content.  Patient is alert and oriented at baseline.   Assessment and Plan: 1. Suspected UTI (Primary) - cephALEXin (KEFLEX) 500 MG capsule; Take 1 capsule (500 mg total) by mouth 2 (two) times daily for 7 days.  Dispense: 14 capsule; Refill: 0  Classic UTI symptoms with absence of alarm signs or symptoms. Prior  history of UTI. Will treat empirically with Keflex for suspected uncomplicated cystitis. Supportive measures and OTC medications reviewed. Strict in-person evaluation precautions discussed.    Follow Up Instructions: I discussed the assessment and treatment plan with the patient. The patient was provided an opportunity to ask questions and all were answered. The patient agreed with the plan and demonstrated an understanding of the instructions.  A copy of instructions were sent to the patient via MyChart unless otherwise noted below.   The patient was advised to call back or seek an in-person evaluation if the symptoms worsen or if the condition fails to improve as anticipated.    Cynthia Velma Lunger, PA-C

## 2023-12-11 ENCOUNTER — Telehealth: Payer: Self-pay | Admitting: Nurse Practitioner

## 2023-12-11 DIAGNOSIS — K089 Disorder of teeth and supporting structures, unspecified: Secondary | ICD-10-CM

## 2023-12-11 MED ORDER — IBUPROFEN 600 MG PO TABS: 600.0000 mg | ORAL_TABLET | Freq: Three times a day (TID) | ORAL | 0 refills | Status: AC | PRN

## 2023-12-11 MED ORDER — PENICILLIN V POTASSIUM 500 MG PO TABS: 500.0000 mg | ORAL_TABLET | Freq: Three times a day (TID) | ORAL | 0 refills | Status: AC

## 2023-12-11 NOTE — Progress Notes (Signed)
 E-Visit for Dental Pain  We are sorry that you are not feeling well.  Here is how we plan to help!  Based on what you have shared with me in the questionnaire, it sounds like you have a dental problem that needs to be evaluated by a dentist.  I have prescribed the following:   Pen VK 500mg  3 times a day for 7 days and Ibuprofen  600mg  3 times a day for 7 days for discomfort  It is imperative that you see a dentist within 10 days of this eVisit to determine the cause of the dental pain and be sure it is adequately treated  A toothache or tooth pain is caused when the nerve in the root of a tooth or surrounding a tooth is irritated. Dental (tooth) infection, decay, injury, or loss of a tooth are the most common causes of dental pain. Pain may also occur after an extraction (tooth is pulled out). Pain sometimes originates from other areas and radiates to the jaw, thus appearing to be tooth pain.Bacteria growing inside your mouth can contribute to gum disease and dental decay, both of which can cause pain. A toothache occurs from inflammation of the central portion of the tooth called pulp. The pulp contains nerve endings that are very sensitive to pain. Inflammation to the pulp or pulpitis may be caused by dental cavities, trauma, and infection.    HOME CARE:   For toothaches: Over-the-counter pain medications such as acetaminophen  or ibuprofen  may be used. Take these as directed on the package while you arrange for a dental appointment. Avoid very cold or hot foods, because they may make the pain worse. You may get relief from biting on a cotton ball soaked in oil of cloves. You can get oil of cloves at most drug stores.  For jaw pain:  Aspirin may be helpful for problems in the joint of the jaw in adults. If pain happens every time you open your mouth widely, the temporomandibular joint (TMJ) may be the source of the pain. Yawning or taking a large bite of food may worsen the pain. An appointment  with your doctor or dentist will help you find the cause.     GET HELP RIGHT AWAY IF:  You have a high fever or chills If you have had a recent head or face injury and develop headache, light headedness, nausea, vomiting, or other symptoms that concern you after an injury to your face or mouth, you could have a more serious injury in addition to your dental injury. A facial rash associated with a toothache: This condition may improve with medication. Contact your doctor for them to decide what is appropriate. Any jaw pain occurring with chest pain: Although jaw pain is most commonly caused by dental disease, it is sometimes referred pain from other areas. People with heart disease, especially people who have had stents placed, people with diabetes, or those who have had heart surgery may have jaw pain as a symptom of heart attack or angina. If your jaw or tooth pain is associated with lightheadedness, sweating, or shortness of breath, you should see a doctor as soon as possible. Trouble swallowing or excessive pain or bleeding from gums: If you have a history of a weakened immune system, diabetes, or steroid use, you may be more susceptible to infections. Infections can often be more severe and extensive or caused by unusual organisms. Dental and gum infections in people with these conditions may require more aggressive treatment. An abscess  may need draining or IV antibiotics, for example.  MAKE SURE YOU   Understand these instructions. Will watch your condition. Will get help right away if you are not doing well or get worse.  Thank you for choosing an e-visit.  Your e-visit answers were reviewed by a board certified advanced clinical practitioner to complete your personal care plan. Depending upon the condition, your plan could have included both over the counter or prescription medications.  Please review your pharmacy choice. Make sure the pharmacy is open so you can pick up prescription  now. If there is a problem, you may contact your provider through [employer] and have the prescription routed to another pharmacy.  Your safety is important to us . If you have drug allergies check your prescription carefully.   For the next 24 hours you can use MyChart to ask questions about today's visit, request a non-urgent call back, or ask for a work or school excuse. You will get an email in the next two days asking about your experience. I hope that your e-visit has been valuable and will speed your recovery.  I have spent 5 minutes in review of e-visit questionnaire, review and updating patient chart, medical decision making and response to patient.   Tarry Blayney W Nakeyia Menden, NP

## 2023-12-17 ENCOUNTER — Telehealth: Payer: Self-pay | Admitting: Family Medicine

## 2023-12-17 DIAGNOSIS — K047 Periapical abscess without sinus: Secondary | ICD-10-CM

## 2023-12-17 NOTE — Progress Notes (Signed)
°  Because Ms. Sunde, I feel your condition warrants further evaluation and I recommend that you be seen in a face-to-face visit.   NOTE: There will be NO CHARGE for this E-Visit   If you are having a true medical emergency, please call 911.     For an urgent face to face visit, [facility] has multiple urgent care centers for your convenience.  Click the link below for the full list of locations and hours, walk-in wait times, appointment scheduling options and driving directions:  Urgent Care - [place], [place], [place], [place], [place], [place]  [place]     Your MyChart E-visit questionnaire answers were reviewed by a board certified advanced clinical practitioner to complete your personal care plan based on your specific symptoms.    Thank you for using e-Visits.

## 2023-12-20 ENCOUNTER — Telehealth: Payer: Self-pay | Admitting: Physician Assistant

## 2023-12-20 DIAGNOSIS — J069 Acute upper respiratory infection, unspecified: Secondary | ICD-10-CM

## 2023-12-20 MED ORDER — FLUTICASONE PROPIONATE 50 MCG/ACT NA SUSP: 2.0000 | Freq: Every day | NASAL | 0 refills | Status: AC

## 2023-12-20 MED ORDER — NAPROXEN 500 MG PO TABS: 500.0000 mg | ORAL_TABLET | Freq: Two times a day (BID) | ORAL | 0 refills | Status: AC

## 2023-12-20 MED ORDER — BENZONATATE 100 MG PO CAPS: 100.0000 mg | ORAL_CAPSULE | Freq: Three times a day (TID) | ORAL | 0 refills | Status: AC | PRN

## 2023-12-20 MED ORDER — ONDANSETRON 4 MG PO TBDP: 4.0000 mg | ORAL_TABLET | Freq: Three times a day (TID) | ORAL | 0 refills | Status: AC | PRN

## 2023-12-20 NOTE — Progress Notes (Signed)
 E visit for Flu like symptoms   We are sorry that you are not feeling well.  Here is how we plan to help! Based on what you have shared with me it looks like you may have flu-like symptoms that should be watched but do not seem to indicate anti-viral treatment.  Influenza or the flu is  an infection caused by a respiratory virus. The flu virus is highly contagious and persons who did not receive their yearly flu vaccination may catch the flu from close contact.  We have anti-viral medications to treat the viruses that cause this infection. They are not a cure and only shorten the course of the infection. These prescriptions are most effective when they are given within the first 2 days of flu symptoms. Antiviral medications are indicated if you have a high risk of complications from the flu. You should  also consider an antiviral medication if you are in close contact with someone who is at risk. These medications can help patients avoid complications from the flu but have side effects that you should know.   For nasal congestion, you may use an oral decongestant such as Mucinex D or if you have glaucoma or high blood pressure use plain Mucinex.  Saline nasal spray or nasal drops can help and can safely be used as often as needed for congestion.  If you have a sore or scratchy throat, use a saltwater gargle-  to  teaspoon of salt dissolved in a 4-ounce to 8-ounce glass of warm water.  Gargle the solution for approximately 15-30 seconds and then spit.  It is important not to swallow the solution.  You can also use throat lozenges/cough drops and Chloraseptic spray to help with throat pain or discomfort.  Warm or cold liquids can also be helpful in relieving throat pain.  For headache, pain or general discomfort, you can use Ibuprofen  or Tylenol  as directed.   Some authorities believe that zinc sprays or the use of Echinacea may shorten the course of your symptoms.  I have prescribed the  following medications to help lessen symptoms: I have prescribed Tessalon  Perles 100 mg. You may take 1-2 capsules every 8 hours as needed for cough, I have prescribed an anti-inflammatory - Naprosyn  500 mg. Take twice daily as needed for fever or body aches for 2 weeks, I have prescribed Fluticasone  nasal spray 2 sprays in each nostril one time per dayasal spray 2 sprays in each nostril one time per day, and I have prescribed Zofran  4 mg tablets 1 every 6 hours if needed for nausea  You are to isolate at home until you have been fever-free for at least 24 hours without a fever-reducing medication, and symptoms have been steadily improving for 24 hours.  If you must be around other household members who do not have symptoms, you need to make sure that both you and the family members are masking consistently with a high-quality mask.  If you note any worsening of symptoms despite treatment, please seek an in-person evaluation ASAP. If you note any significant shortness of breath or any chest pain, please seek ED evaluation. Please do not delay care!  ANYONE WHO HAS FLU SYMPTOMS SHOULD: Stay home. The flu is highly contagious and going out or to work exposes others! Be sure to drink plenty of fluids. Water is fine as well as fruit juices, sodas and electrolyte beverages. You may want to stay away from caffeine or alcohol. If you are nauseated, try taking small  sips of liquids. How do you know if you are getting enough fluid? Your urine should be a pale yellow or almost colorless. Get rest. Taking a steamy shower or using a humidifier may help nasal congestion and ease sore throat pain. Using a saline nasal spray works much the same way. Cough drops, hard candies and sore throat lozenges may ease your cough. Line up a caregiver. Have someone check on you regularly.  GET HELP RIGHT AWAY IF: You cannot keep down liquids or your medications. You become short of breath Your fell like you are going to pass  out or loose consciousness. Your symptoms persist after you have completed your treatment plan  MAKE SURE YOU  Understand these instructions. Will watch your condition. Will get help right away if you are not doing well or get worse.  Your e-visit answers were reviewed by a board certified advanced clinical practitioner to complete your personal care plan.  Depending on the condition, your plan could have included both over the counter or prescription medications.  If there is a problem please reply  once you have received a response from your provider.  Your safety is important to us .  If you have drug allergies check your prescription carefully.    You can use MyChart to ask questions about todays visit, request a non-urgent call back, or ask for a work or school excuse for 24 hours related to this e-Visit. If it has been greater than 24 hours you will need to follow up with your provider, or enter a new e-Visit to address those concerns.  You will get an e-mail in the next two days asking about your experience.  I hope that your e-visit has been valuable and will speed your recovery. Thank you for using e-visits.   I have spent 5 minutes in review of e-visit questionnaire, review and updating patient chart, medical decision making and response to patient.   Delon CHRISTELLA Dickinson, PA-C

## 2024-02-22 ENCOUNTER — Ambulatory Visit: Payer: Self-pay
